# Patient Record
Sex: Male | Born: 1990 | Race: White | Hispanic: No | Marital: Married | Smoking: Current every day smoker
Health system: Southern US, Community
[De-identification: ages and names within clinical notes are randomized; demographics above are authoritative.]

## PROBLEM LIST (undated history)

## (undated) ENCOUNTER — Emergency Department (HOSPITAL_COMMUNITY): Admission: EM | Payer: Self-pay | Source: Home / Self Care

## (undated) DIAGNOSIS — S065XAA Traumatic subdural hemorrhage with loss of consciousness status unknown, initial encounter: Secondary | ICD-10-CM

## (undated) DIAGNOSIS — F32A Depression, unspecified: Secondary | ICD-10-CM

## (undated) DIAGNOSIS — F1910 Other psychoactive substance abuse, uncomplicated: Secondary | ICD-10-CM

## (undated) DIAGNOSIS — J45909 Unspecified asthma, uncomplicated: Secondary | ICD-10-CM

## (undated) DIAGNOSIS — F329 Major depressive disorder, single episode, unspecified: Secondary | ICD-10-CM

## (undated) DIAGNOSIS — F1990 Other psychoactive substance use, unspecified, uncomplicated: Secondary | ICD-10-CM

## (undated) HISTORY — DX: Traumatic subdural hemorrhage with loss of consciousness status unknown, initial encounter: S06.5XAA

## (undated) HISTORY — DX: Depression, unspecified: F32.A

## (undated) HISTORY — DX: Other psychoactive substance abuse, uncomplicated: F19.10

## (undated) HISTORY — PX: KNEE SURGERY: SHX244

---
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
## 2012-07-27 ENCOUNTER — Emergency Department: Payer: Self-pay | Admitting: Emergency Medicine

## 2012-08-17 ENCOUNTER — Emergency Department: Payer: Self-pay | Admitting: Emergency Medicine

## 2014-05-19 NOTE — H&P (Signed)
PATIENT NAME:  Edward Romero, Edward Romero MR#:  [id number] DATE OF BIRTH:  01/07/1991  DATE OF ADMISSION:  06/09/2011  REFERRING PHYSICIAN: Dorothea Glassman, MD  ADMITTING PHYSICIAN: Caryn Section, MD   REASON FOR ADMISSION: Status post overdose on multiple medications and mood lability.   CHIEF COMPLAINT: "Overdosed on multiple medications."  IDENTIFYING INFORMATION: Mr. Edward Romero is a 24 year old homeless Caucasian male currently engaged for the past few months living with his girlfriend in a car for the past one week. At times he does stay with his girlfriend's family. He has a history of polysubstance dependence and bipolar disorder.   HISTORY OF PRESENT ILLNESS: Mr. Edward Romero is a 24 year old engaged Caucasian male with a history of polysubstance dependence as well as bipolar disorder who was brought to the emergency room by ambulance called by his fiancee's stepfather after the patient was exhibiting bizarre behavior and then found to be lethargic and unresponsive. The patient had admitted to taking an overdose on Klonopin and trazodone and said that he was celebrating his birthday.  The patient had apparently been on his neighbors back porch in his boxers and then collapsed. The patient's fiancee's stepfather then called the ambulance. It is unclear whether or not the patient had a suicide attempt. His mother reports that he has been talking about suicide for the past several weeks and apparently had a suicide attempt by cutting his wrist two months ago. The patient himself denies that it was a suicide attempt and states that he was just partying. He does have a history of polysubstance dependence and has been to rehab 5 to 6 times in the past, since the age of 78. The patient has been using K2 spice every day for the past two weeks and also smoking marijuana every 2. He has a history of IV heroin use, but says he has not been using heroin since the age of 20. He also uses cocaine once every 2 to 3 months.  There is also abuse of benzodiazepines and methamphetamines. He uses alcohol only once a month. The patient does smoke cigarettes, one pack a day, since the age of 36. He does report having had some visual hallucinations last night of seeing his neighbors getting their legs chopped off. He also reports auditory hallucinations all day long yesterday, but cannot remember what the voices were saying. Thoughts during the interview were somewhat disorganized. The patient was very anxious and insight and judgment were poor. The patient was wanting to leave the hospital and did not want to acknowledge the severity of the drug use. The patient was quite agitated when he was in the emergency room and had to be placed in four-point restraints. A lot of collateral information was taken from the patient's mother who indicates that the patient has not been able to stop using drugs in the past 2 to 3 years. She says that he is no longer allowed in her household because he steals money and has been unable to hold a steady job to support himself. Although he has been to multiple rehabs, he has not had any inpatient psychiatric treatment in the past. There is a prior diagnosis of bipolar disorder and the patient had been on Seroquel and Abilify in the past but has been noncompliant with psychotropic medications for the past several months. He does not currently have an outpatient psychiatrist.   PAST PSYCHIATRIC HISTORY: As stated in the History of Present Illness. No current outpatient psychiatrist or prior inpatient psychiatric hospitalizations, only  multiple rehab treatment in the past, at [facility] and [facility]. The patient has been tried on Seroquel and Abilify in the past. He has also had court ordered substance abuse treatment. He does have a history of cutting his wrist two months.  SUBSTANCE ABUSE HISTORY:  As stated in the history of present illness, toxicology screen in the emergency room was positive for TCAs, but negative  for all other substances and ethanol level was less than 3.   FAMILY PSYCHIATRIC HISTORY: The patient's father is an alcoholic.   PAST MEDICAL HISTORY: He denies any major medical conditions. He does have a history of a TBI from a baseball injury, but no loss of consciousness. He denies any history of any prior seizures.   OUTPATIENT MEDICATIONS: None.   ALLERGIES: No known drug allergies.   SOCIAL HISTORY: The patient was born and raised in [place] primarily by his father until the age of 53. Hen then came to live with his mother and stepfather, at the age of 53, and he had been living with them over the past one year, but does not get along with his stepfather. His stepfather would not allow him in the house secondary to stealing money and drug use. The patient denies any history of any physical or sexual abuse. He has a ninth grade education and got his GED as well. He was working full-time at [employer] for a six-month period but then lost his job three weeks ago secondary to problems with transportation. He has been living with his fiancee in a car for the past one week and sometimes sleeps in his fiancee's family's house. He does not have any children.  LEGAL HISTORY: He has been arrested for possession of methamphetamines, assault, and trespassing.   MENTAL STATUS EXAM: Mr. Edward Romero is a 24 year old thin-appearing Caucasian male with short brown hair. He was alert but somewhat disoriented to the date.  He knew it was May 2013 but could not name the day of the month or day of the week.  He named the prior presidents as Obama, Bush and then [employer]. Speech was fluent and coherent. Mood was described as being "mixed". The patient said he was depressed and angry, but also felt like he was on a high "jacked up". Thought processes were somewhat disorganized at times. The patient was focused on discharge. He did not appear to be responding to internal stimuli. He denied any suicidal or homicidal thoughts. He  denied any current auditory or visual hallucinations. He denied any paranoid thoughts or delusions. Attention and concentration were poor. Judgment and insight were poor. Recall was three out of three initially and zero out of three after 5 minutes. The patient spelled world backwards as dlrow. When asked about simple proverbs, the patient said "I don't know".  SUICIDE RISK ASSESSMENT: At this time the patient remains at a moderate to high risk of harm to self and others secondary to recent mood lability as well as bizarre behavior and psychosis. He denies any access to guns. He denies any intent to harm himself but per his mother and collateral information he has been endorsing suicidal thoughts.  REVIEW OF SYSTEMS: CONSTITUTIONAL: He denies any weakness, fatigue or weight changes. He denies any fever, chills, or night sweats. HEAD: He denies headaches or dizziness. EYES: He denies any diplopia or blurred vision. ENT: No hearing loss, neck pain or throat.  RESPIRATORY: He denies any shortness of breath or cough. CARDIOVASCULAR: He denies any chest pain or orthopnea. GASTROINTESTINAL: He  denies any nausea, vomiting, or abdominal pain. He denies any change in bowel movements. GENITOURINARY: He denies incontinence or problems with frequency of urine. ENDOCRINE: He denies any heat or cold intolerance. LYMPHATIC: He denies any anemia or easy bruising. MUSCULOSKELETAL: He denies any muscle or joint pain. NEUROLOGIC: He denies any tingling or weakness. PSYCHIATRIC: Please see History of Present Illness.    PHYSICAL EXAMINATION:   VITAL SIGNS: Blood pressure 98/56, pulse 65, respirations 16, and temperature 96.8.   HEENT: Normocephalic, atraumatic. Pupils are equal, round and reactive to light and accommodation. Extraocular movements intact. Oral mucosa was moist. No lesions noted.   NECK: Supple. No cervical lymphadenopathy or thyromegaly present.   LUNGS: Clear to auscultation bilaterally. No crackles,  rales, or rhonchi.   CARDIAC: S1 and S2 present, regular rate and rhythm. No murmurs, rubs, or gallops.   ABDOMEN: Soft and normoactive bowel sounds present in all four quadrants. No tenderness noted.   EXTREMITIES: The patient did have some superficial lacerations on his right hand and some bruising around his left wrist. No rash, cyanosis, clubbing, or edema.   NEUROLOGIC: Cranial nerves II through XII grossly intact. Gait was normal and steady. Sensation intact. No hypo or hyperreflexia noted.   LABS/STUDIES: Sodium 142, potassium 3.4, chloride 111, CO2 25, BUN 10, creatinine 0.81, glucose 96, alkaline phosphatase 61, AST 19, ALT 16. CK 148. Urine tox screen positive for TCAs but negative for all other substances. Acetaminophen and salicylate levels were unremarkable. CBC within normal limits.   EKG showed a ventricular rate of 107 with a QTc interval of 453.   DIAGNOSES:   AXIS I:  1. Bipolar disorder with psychosis rule out substance-induced mood disorder with psychosis. 2. Cannabis abuse. 3. Methamphetamine abuse. 4. History of opioid dependence, in full remission.  5. Benzodiazepine abuse. 6. Cocaine abuse.   AXIS II: Deferred.   AXIS III: History of TBI.   AXIS IV: Severe - Financial problems, homelessness, comorbid substance use, lack of compliance with psychiatric treatment, and legal problems.   AXIS V: GAF at present equals 30.   ASSESSMENT AND TREATMENT RECOMMENDATIONS: Mr. Buller is a 24 year old Caucasian male with a history of polysubstance dependence as well as a mood disorder. Mood disorder can be substance-induced. He is admitting to cannabis abuse as well as using K2 spice daily for the past two weeks. It is unclear whether or not the overdose was an intentional suicide attempt. He has been talking about suicide and attempted to cut his wrist two weeks ago. In addition he did have some psychosis yesterday and visual hallucinations. These may have been  substance-induced. We will admit to inpatient psychiatry for medication management, safety, and stabilization and place on close observation and suicide precautions. He will remain under IVC at this time.  1. Mood disorder: We will plan to start the patient on Zyprexa 5 mg p.o. daily with the plan to titrate up as tolerated and as needed. We will get a lipid panel in the a.m. as well as B12 and folic acid.  2. Polysubstance dependence: The patient was advised to abstain from alcohol and all illicit drugs as they may worsen mood symptoms. We will refer for residential substance abuse treatment at [facility]. He has not been drinking alcohol heavily and therefore will not need CIWA scale.  3. Disposition: Referral to [facility] for residential substance abuse treatment and psychotropic medication management. Followup appointment will be with [facility] or [facility] in the community when he returns from [facility].  His mother is fully supportive of further substance abuse treatment on an inpatient basis. ____________________________ Doralee AlbinoAarti K. Maryruth BunKapur, MD akk:slb D: 06/09/2011 13:44:00 ET T: 06/09/2011 14:12:51 ET JOB#: [id number]  cc: Aarti K. Maryruth BunKapur, MD, <Dictator> Darliss RidgelAARTI K KAPUR MD ELECTRONICALLY SIGNED 06/10/2011 14:50

## 2015-03-19 ENCOUNTER — Emergency Department
Admission: EM | Admit: 2015-03-19 | Discharge: 2015-03-19 | Disposition: A | Discharge status: Home / Self Care | Payer: Self-pay | Attending: Emergency Medicine | Admitting: Emergency Medicine

## 2015-03-19 ENCOUNTER — Emergency Department: Payer: Self-pay

## 2015-03-19 DIAGNOSIS — J069 Acute upper respiratory infection, unspecified: Secondary | ICD-10-CM | POA: Insufficient documentation

## 2015-03-19 DIAGNOSIS — F17200 Nicotine dependence, unspecified, uncomplicated: Secondary | ICD-10-CM | POA: Insufficient documentation

## 2015-03-19 LAB — RAPID INFLUENZA A&B ANTIGENS (ARMC ONLY)
INFLUENZA A (ARMC): NOT DETECTED
INFLUENZA B (ARMC): NOT DETECTED

## 2015-03-19 MED ORDER — PREDNISONE 20 MG PO TABS: ORAL_TABLET | ORAL | Status: DC

## 2015-03-19 MED ORDER — PSEUDOEPH-BROMPHEN-DM 30-2-10 MG/5ML PO SYRP: 10.0000 mL | ORAL_SOLUTION | Freq: Four times a day (QID) | ORAL | Status: DC | PRN

## 2015-03-19 NOTE — ED Notes (Signed)
Pt arrived to Ed with c/o fever, cough, and body aches x 3 days. Pt reports fever of 103 at home. Pt took Tylenol at 6pm.

## 2015-03-19 NOTE — ED Provider Notes (Signed)
[facility] Emergency Department Provider Note  ____________________________________________  Time seen: Approximately 8:18 PM  I have reviewed the triage vital signs and the nursing notes.   HISTORY  Chief Complaint Cough    HPI Edward Romero is a 25 y.o. male, NAD, presents to the emergency department with 3 day history of cough, chest congestion, fevers, body aches. No sore throat when he coughs. Has taken Tylenol and NyQuil with minimal relief of symptoms. No known sick contacts. Denies chest pain, back pain, abdominal pain, nausea, vomiting, diarrhea. Has not had any headaches or visual changes  History reviewed. No pertinent past medical history.  There are no active problems to display for this patient.   History reviewed. No pertinent past surgical history.  Current Outpatient Rx  Name  Route  Sig  Dispense  Refill  . brompheniramine-pseudoephedrine-DM 30-2-10 MG/5ML syrup   Oral   Take 10 mLs by mouth 4 (four) times daily as needed.   200 mL   0   . predniSONE (DELTASONE) 20 MG tablet      Take 2 tablets by mouth, once daily, for 5 days   10 tablet   0     Allergies Review of patient's allergies indicates no known allergies.  History reviewed. No pertinent family history.  Social History Social History  Substance Use Topics  . Smoking status: Current Every Day Smoker  . Smokeless tobacco: None  . Alcohol Use: No     Review of Systems  Constitutional:  Positive fever, chills, fatigue  Eyes: No visual changes. No discharge ENT:  Positive sore throat, nasal congestion. No ear pain. Cardiovascular: No chest pain. Respiratory:  Positive productive cough. No shortness of breath. No wheezing.  Gastrointestinal: No abdominal pain.  No nausea, vomiting, diarrhea. Musculoskeletal:  Positive for general myalgias. Skin: Negative for rash. Neurological: Negative for headaches, focal weakness or numbness. 10-point ROS otherwise  negative.  ____________________________________________   PHYSICAL EXAM:  VITAL SIGNS: ED Triage Vitals  Enc Vitals Group     BP 03/19/15 2000 128/75 mmHg     Pulse Rate 03/19/15 1959 104     Resp 03/19/15 1959 18     Temp 03/19/15 1959 99.2 F (37.3 C)     Temp Source 03/19/15 1959 Oral     SpO2 03/19/15 1959 96 %     Weight 03/19/15 1959 140 lb (63.504 kg)     Height 03/19/15 1959 6' (1.829 m)     Head Cir --      Peak Flow --      Pain Score 03/19/15 2001 4     Pain Loc --      Pain Edu? --      Excl. in GC? --     Constitutional: Alert and oriented. Well appearing and in no acute distress. Eyes: Conjunctivae are normal. PERRL. EOMI without pain.  Head: Atraumatic. ENT:      Ears:  TMs visualized bilaterally with mild injection but with normal light reflex, no effusion, no bulging or perforation.      Nose:  Mild congestion with moderate clear rhinnorhea.      Mouth/Throat: Mucous membranes are moist.  Neck: No stridor. No cervical spine tenderness to palpation. Supple with full range of motion. Hematological/Lymphatic/Immunilogical: No cervical lymphadenopathy. Cardiovascular:    Normal rate, regular rhythm. Normal S1 and S2.  Good peripheral circulation. Respiratory:  Dry cough through visit. Normal respiratory effort without tachypnea or retractions. Lungs CTAB. Neurologic:  Normal speech and  language. No gross focal neurologic deficits are appreciated.  Skin:  Skin is warm, dry and intact. No rash noted. Psychiatric: Mood and affect are normal. Speech and behavior are normal. Patient exhibits appropriate insight and judgement.   ____________________________________________   LABS (all labs ordered are listed, but only abnormal results are displayed)  Labs Reviewed  RAPID INFLUENZA A&B ANTIGENS ([facility] ONLY)   ____________________________________________  EKG  None ____________________________________________  RADIOLOGY I have personally viewed and  evaluated these images (plain radiographs) as part of my medical decision making, as well as reviewing the written report by the radiologist.  Dg Chest 2 View  03/19/2015  CLINICAL DATA:  Fever and cough with body aches for 3 days. EXAM: CHEST  2 VIEW COMPARISON:  None. FINDINGS: The heart size and mediastinal contours are within normal limits. Both lungs are clear. The visualized skeletal structures are unremarkable. IMPRESSION: No active cardiopulmonary disease. Electronically Signed   By: Kennith Center M.D.   On: 03/19/2015 20:16    ____________________________________________    PROCEDURES  Procedure(s) performed: None    Medications - No data to display   ____________________________________________   INITIAL IMPRESSION / ASSESSMENT AND PLAN / ED COURSE  Pertinent labs & imaging results that were available during my care of the patient were reviewed by me and considered in my medical decision making (see chart for details).   Patient with negative CXR and negative in house flu testing. Patient's diagnosis is consistent with viral upper respiratory infection. Patient will be discharged home with prescriptions for Bromfed DM cough syrup and prednisone to be taken as prescribed. Patient is to follow up with his local PCP or [facility] if symptoms persist past this treatment course. Patient is given ED precautions to return to the ED for any worsening or new symptoms.    ____________________________________________  FINAL CLINICAL IMPRESSION(S) / ED DIAGNOSES  Final diagnoses:  Viral upper respiratory infection      NEW MEDICATIONS STARTED DURING THIS VISIT:  New Prescriptions   BROMPHENIRAMINE-PSEUDOEPHEDRINE-DM 30-2-10 MG/5ML SYRUP    Take 10 mLs by mouth 4 (four) times daily as needed.   PREDNISONE (DELTASONE) 20 MG TABLET    Take 2 tablets by mouth, once daily, for 5 days         Hope Pigeon, PA-C 03/19/15 2048  Sharman Cheek, MD 03/21/15  [phone]

## 2015-03-19 NOTE — ED Notes (Signed)
Pt ambulatory to room 44 without difficulty or distress noted, mask in  Place; pt reports x 3 days having fever, prod cough green sputum, sinus congestion and body aches; resp even/unlab, lungs clear; tylenol taken 2hrs PTA

## 2015-03-19 NOTE — Discharge Instructions (Signed)
Upper Respiratory Infection, Adult °Most upper respiratory infections (URIs) are a viral infection of the air passages leading to the lungs. A URI affects the nose, throat, and upper air passages. The most common type of URI is nasopharyngitis and is typically referred to as "the common cold." °URIs run their course and usually go away on their own. Most of the time, a URI does not require medical attention, but sometimes a bacterial infection in the upper airways can follow a viral infection. This is called a secondary infection. Sinus and middle ear infections are common types of secondary upper respiratory infections. °Bacterial pneumonia can also complicate a URI. A URI can worsen asthma and chronic obstructive pulmonary disease (COPD). Sometimes, these complications can require emergency medical care and may be life threatening.  °CAUSES °Almost all URIs are caused by viruses. A virus is a type of germ and can spread from one person to another.  °RISKS FACTORS °You may be at risk for a URI if:  °· You smoke.   °· You have chronic heart or lung disease. °· You have a weakened defense (immune) system.   °· You are very young or very old.   °· You have nasal allergies or asthma. °· You work in crowded or poorly ventilated areas. °· You work in health care facilities or schools. °SIGNS AND SYMPTOMS  °Symptoms typically develop 2-3 days after you come in contact with a cold virus. Most viral URIs last 7-10 days. However, viral URIs from the influenza virus (flu virus) can last 14-18 days and are typically more severe. Symptoms may include:  °· Runny or stuffy (congested) nose.   °· Sneezing.   °· Cough.   °· Sore throat.   °· Headache.   °· Fatigue.   °· Fever.   °· Loss of appetite.   °· Pain in your forehead, behind your eyes, and over your cheekbones (sinus pain). °· Muscle aches.   °DIAGNOSIS  °Your health care provider may diagnose a URI by: °· Physical exam. °· Tests to check that your symptoms are not due to  another condition such as: °¨ Strep throat. °¨ Sinusitis. °¨ Pneumonia. °¨ Asthma. °TREATMENT  °A URI goes away on its own with time. It cannot be cured with medicines, but medicines may be prescribed or recommended to relieve symptoms. Medicines may help: °· Reduce your fever. °· Reduce your cough. °· Relieve nasal congestion. °HOME CARE INSTRUCTIONS  °· Take medicines only as directed by your health care provider.   °· Gargle warm saltwater or take cough drops to comfort your throat as directed by your health care provider. °· Use a warm mist humidifier or inhale steam from a shower to increase air moisture. This may make it easier to breathe. °· Drink enough fluid to keep your urine clear or pale yellow.   °· Eat soups and other clear broths and maintain good nutrition.   °· Rest as needed.   °· Return to work when your temperature has returned to normal or as your health care provider advises. You may need to stay home longer to avoid infecting others. You can also use a face mask and careful hand washing to prevent spread of the virus. °· Increase the usage of your inhaler if you have asthma.   °· Do not use any tobacco products, including cigarettes, chewing tobacco, or electronic cigarettes. If you need help quitting, ask your health care provider. °PREVENTION  °The best way to protect yourself from getting a cold is to practice good hygiene.  °· Avoid oral or hand contact with people with cold   symptoms.   °· Wash your hands often if contact occurs.   °There is no clear evidence that vitamin C, vitamin E, echinacea, or exercise reduces the chance of developing a cold. However, it is always recommended to get plenty of rest, exercise, and practice good nutrition.  °SEEK MEDICAL CARE IF:  °· You are getting worse rather than better.   °· Your symptoms are not controlled by medicine.   °· You have chills. °· You have worsening shortness of breath. °· You have brown or red mucus. °· You have yellow or brown nasal  discharge. °· You have pain in your face, especially when you bend forward. °· You have a fever. °· You have swollen neck glands. °· You have pain while swallowing. °· You have white areas in the back of your throat. °SEEK IMMEDIATE MEDICAL CARE IF:  °· You have severe or persistent: °¨ Headache. °¨ Ear pain. °¨ Sinus pain. °¨ Chest pain. °· You have chronic lung disease and any of the following: °¨ Wheezing. °¨ Prolonged cough. °¨ Coughing up blood. °¨ A change in your usual mucus. °· You have a stiff neck. °· You have changes in your: °¨ Vision. °¨ Hearing. °¨ Thinking. °¨ Mood. °MAKE SURE YOU:  °· Understand these instructions. °· Will watch your condition. °· Will get help right away if you are not doing well or get worse. °  °This information is not intended to replace advice given to you by your health care provider. Make sure you discuss any questions you have with your health care provider. °  °Document Released: 07/07/2000 Document Revised: 05/28/2014 Document Reviewed: 04/18/2013 °Elsevier Interactive Patient Education ©2016 [employer] ° °Cool Mist Vaporizers °Vaporizers may help relieve the symptoms of a cough and cold. They add moisture to the air, which helps mucus to become thinner and less sticky. This makes it easier to breathe and cough up secretions. Cool mist vaporizers do not cause serious burns like hot mist vaporizers, which may also be called steamers or humidifiers. Vaporizers have not been proven to help with colds. You should not use a vaporizer if you are allergic to mold. °HOME CARE INSTRUCTIONS °· Follow the package instructions for the vaporizer. °· Do not use anything other than distilled water in the vaporizer. °· Do not run the vaporizer all of the time. This can cause mold or bacteria to grow in the vaporizer. °· Clean the vaporizer after each time it is used. °· Clean and dry the vaporizer well before storing it. °· Stop using the vaporizer if worsening respiratory symptoms  develop. °  °This information is not intended to replace advice given to you by your health care provider. Make sure you discuss any questions you have with your health care provider. °  °Document Released: 10/09/2003 Document Revised: 01/16/2013 Document Reviewed: 05/31/2012 °Elsevier Interactive Patient Education ©2016 [employer] ° °

## 2016-02-14 ENCOUNTER — Encounter: Payer: Self-pay | Admitting: Emergency Medicine

## 2016-02-14 ENCOUNTER — Emergency Department: Payer: Self-pay

## 2016-02-14 ENCOUNTER — Emergency Department
Admission: EM | Admit: 2016-02-14 | Discharge: 2016-02-14 | Disposition: A | Discharge status: Home / Self Care | Payer: Self-pay | Attending: Emergency Medicine | Admitting: Emergency Medicine

## 2016-02-14 DIAGNOSIS — J45909 Unspecified asthma, uncomplicated: Secondary | ICD-10-CM | POA: Insufficient documentation

## 2016-02-14 DIAGNOSIS — N201 Calculus of ureter: Secondary | ICD-10-CM | POA: Insufficient documentation

## 2016-02-14 DIAGNOSIS — F17200 Nicotine dependence, unspecified, uncomplicated: Secondary | ICD-10-CM | POA: Insufficient documentation

## 2016-02-14 DIAGNOSIS — R109 Unspecified abdominal pain: Secondary | ICD-10-CM

## 2016-02-14 HISTORY — DX: Unspecified asthma, uncomplicated: J45.909

## 2016-02-14 LAB — BASIC METABOLIC PANEL
Anion gap: 9 (ref 5–15)
BUN: 18 mg/dL (ref 6–20)
CHLORIDE: 103 mmol/L (ref 101–111)
CO2: 24 mmol/L (ref 22–32)
CREATININE: 1.54 mg/dL — AB (ref 0.61–1.24)
Calcium: 10.3 mg/dL (ref 8.9–10.3)
Glucose, Bld: 111 mg/dL — ABNORMAL HIGH (ref 65–99)
Potassium: 4.7 mmol/L (ref 3.5–5.1)
SODIUM: 136 mmol/L (ref 135–145)

## 2016-02-14 LAB — URINALYSIS, COMPLETE (UACMP) WITH MICROSCOPIC
BILIRUBIN URINE: NEGATIVE
Bacteria, UA: NONE SEEN
GLUCOSE, UA: NEGATIVE mg/dL
KETONES UR: 20 mg/dL — AB
NITRITE: NEGATIVE
PH: 5 (ref 5.0–8.0)
PROTEIN: 30 mg/dL — AB
Specific Gravity, Urine: 1.026 (ref 1.005–1.030)
Squamous Epithelial / LPF: NONE SEEN

## 2016-02-14 LAB — CBC
HCT: 46.6 % (ref 40.0–52.0)
HEMOGLOBIN: 15.9 g/dL (ref 13.0–18.0)
MCH: 31 pg (ref 26.0–34.0)
MCHC: 34.2 g/dL (ref 32.0–36.0)
MCV: 90.7 fL (ref 80.0–100.0)
PLATELETS: 236 10*3/uL (ref 150–440)
RBC: 5.14 MIL/uL (ref 4.40–5.90)
RDW: 13.4 % (ref 11.5–14.5)
WBC: 18.2 10*3/uL — ABNORMAL HIGH (ref 3.8–10.6)

## 2016-02-14 MED ORDER — HYDROCODONE-ACETAMINOPHEN 5-325 MG PO TABS: 1.0000 | ORAL_TABLET | ORAL | 0 refills | Status: DC | PRN

## 2016-02-14 MED ORDER — TAMSULOSIN HCL 0.4 MG PO CAPS: 0.4000 mg | ORAL_CAPSULE | Freq: Every day | ORAL | 0 refills | Status: DC

## 2016-02-14 MED ORDER — ONDANSETRON 4 MG PO TBDP: 4.0000 mg | ORAL_TABLET | Freq: Three times a day (TID) | ORAL | 0 refills | Status: DC | PRN

## 2016-02-14 MED ORDER — NAPROXEN 500 MG PO TABS
500.0000 mg | ORAL_TABLET | Freq: Two times a day (BID) | ORAL | 2 refills | Status: DC
Start: 2016-02-14 — End: 2016-04-28

## 2016-02-14 MED ORDER — KETOROLAC TROMETHAMINE 30 MG/ML IJ SOLN
30.0000 mg | Freq: Once | INTRAMUSCULAR | Status: AC
  Administered 2016-02-14: 30 mg via INTRAMUSCULAR

## 2016-02-14 MED ORDER — KETOROLAC TROMETHAMINE 30 MG/ML IJ SOLN
INTRAMUSCULAR | Status: AC
  Administered 2016-02-14: 30 mg via INTRAMUSCULAR
  Filled 2016-02-14: qty 1

## 2016-02-14 NOTE — ED Provider Notes (Signed)
[facility] Emergency Department Provider Note   ____________________________________________    I have reviewed the triage vital signs and the nursing notes.   HISTORY  Chief Complaint Flank Pain     HPI Edward Romero is a 26 y.o. male who presents with complaints of flank pain. Patient reports right-sided flank pain that started overnight, he reports it began abruptly. He denies fevers or chills. Mild nausea no vomiting. Has a history of a kidney stone several years ago. Pain is sharp and moderate to severe. Primarily in the right flank radiating into the right groin   Past Medical History:  Diagnosis Date  . Asthma   . Kidney stone     There are no active problems to display for this patient.   History reviewed. No pertinent surgical history.  Prior to Admission medications   Medication Sig Start Date End Date Taking? Authorizing Provider  brompheniramine-pseudoephedrine-DM 30-2-10 MG/5ML syrup Take 10 mLs by mouth 4 (four) times daily as needed. 03/19/15   [name], PA-C  HYDROcodone-acetaminophen (NORCO/VICODIN) 5-325 MG tablet Take 1 tablet by mouth every 4 (four) hours as needed for moderate pain. 02/14/16   Jene Everyobert Avyay Coger, MD  naproxen (NAPROSYN) 500 MG tablet Take 1 tablet (500 mg total) by mouth 2 (two) times daily with a meal. 02/14/16   Jene Everyobert [name], MD  ondansetron (ZOFRAN ODT) 4 MG disintegrating tablet Take 1 tablet (4 mg total) by mouth every 8 (eight) hours as needed for nausea or vomiting. 02/14/16   Jene Everyobert [name], MD  predniSONE (DELTASONE) 20 MG tablet Take 2 tablets by mouth, once daily, for 5 days 03/19/15   [name], PA-C  tamsulosin (FLOMAX) 0.4 MG CAPS capsule Take 1 capsule (0.4 mg total) by mouth daily. 02/14/16   Jene Everyobert [name], MD     Allergies Patient has no known allergies.  No family history on file.  Social History Social History  Substance Use Topics  . Smoking status: Current Every Day Smoker  .  Smokeless tobacco: Never Used  . Alcohol use Yes     Comment: occ    Review of Systems  Constitutional: No fever/chills Eyes: No visual changes.    Respiratory: Denies shortness of breath. Gastrointestinal: As above  Genitourinary: Negative for dysuria. Musculoskeletal: As above Skin: Negative for rash. Neurological: Negative for headaches   10-point ROS otherwise negative.  ____________________________________________   PHYSICAL EXAM:  VITAL SIGNS: ED Triage Vitals  Enc Vitals Group     BP 02/14/16 1411 (!) 130/11     Pulse Rate 02/14/16 1411 74     Resp 02/14/16 1411 16     Temp 02/14/16 1411 97.8 F (36.6 C)     Temp Source 02/14/16 1411 Oral     SpO2 02/14/16 1411 100 %     Weight 02/14/16 1411 140 lb (63.5 kg)     Height --      Head Circumference --      Peak Flow --      Pain Score 02/14/16 1412 8     Pain Loc --      Pain Edu? --      Excl. in GC? --     Constitutional: Alert and oriented. No acute distress. Pleasant and interactive Eyes: Conjunctivae are normal.   Nose: No congestion/rhinnorhea. Mouth/Throat: Mucous membranes are moist.    Cardiovascular: Normal rate, regular rhythm.  Good peripheral circulation. Respiratory:   No retractions. Lungs CTAB. Gastrointestinal: Soft and nontender. No distention.  No CVA tenderness. Genitourinary: deferred Musculoskeletal: No lower extremity tenderness nor edema.  Warm and well perfused Neurologic:  Normal speech and language. No gross focal neurologic deficits are appreciated.  Skin:  Skin is warm, dry and intact. No rash noted. Psychiatric: Mood and affect are normal. Speech and behavior are normal.  ____________________________________________   LABS (all labs ordered are listed, but only abnormal results are displayed)  Labs Reviewed  CBC - Abnormal; Notable for the following:       Result Value   WBC 18.2 (*)    All other components within normal limits  BASIC METABOLIC PANEL - Abnormal;  Notable for the following:    Glucose, Bld 111 (*)    Creatinine, Ser 1.54 (*)    All other components within normal limits  URINALYSIS, COMPLETE (UACMP) WITH MICROSCOPIC   ____________________________________________  EKG  None ____________________________________________  RADIOLOGY  CT demonstrates a 5 mm proximal right stone ____________________________________________   PROCEDURES  Procedure(s) performed: No    Critical Care performed: No ____________________________________________   INITIAL IMPRESSION / ASSESSMENT AND PLAN / ED COURSE  Pertinent labs & imaging results that were available during my care of the patient were reviewed by me and considered in my medical decision making (see chart for details).  Patient persist with right-sided flank pain of relatively abrupt onset, suspicious for kidney stone. Pending urinalysis and CT renal stone study. IM Toradol given for pain.  ----------------------------------------- 5:35 PM on 02/14/2016 -----------------------------------------     patient had complete relief of pain with Toradol. He is well-appearing and in no acute distress. If urinalysis is reassuring we will discharge with pain medications and urology follow-up. ____________________________________________   FINAL CLINICAL IMPRESSION(S) / ED DIAGNOSES  Final diagnoses:  Right flank pain  Ureterolithiasis      NEW MEDICATIONS STARTED DURING THIS VISIT:  New Prescriptions   HYDROCODONE-ACETAMINOPHEN (NORCO/VICODIN) 5-325 MG TABLET    Take 1 tablet by mouth every 4 (four) hours as needed for moderate pain.   NAPROXEN (NAPROSYN) 500 MG TABLET    Take 1 tablet (500 mg total) by mouth 2 (two) times daily with a meal.   ONDANSETRON (ZOFRAN ODT) 4 MG DISINTEGRATING TABLET    Take 1 tablet (4 mg total) by mouth every 8 (eight) hours as needed for nausea or vomiting.   TAMSULOSIN (FLOMAX) 0.4 MG CAPS CAPSULE    Take 1 capsule (0.4 mg total) by mouth  daily.     Note:  This document was prepared using Dragon voice recognition software and may include unintentional dictation errors.    [name], MD 02/14/16 [phone]

## 2016-02-14 NOTE — ED Triage Notes (Signed)
Patient to ED c/o right flank pain. Patient states that pain started this morning around 4 am. Patient states that he has had 1 episode of vomiting. Patient has hx/o kidney stones.

## 2016-04-19 ENCOUNTER — Encounter (HOSPITAL_COMMUNITY): Payer: Self-pay | Admitting: Emergency Medicine

## 2016-04-19 ENCOUNTER — Emergency Department (HOSPITAL_COMMUNITY): Payer: Self-pay

## 2016-04-19 ENCOUNTER — Observation Stay (HOSPITAL_COMMUNITY)
Admission: EM | Admit: 2016-04-19 | Discharge: 2016-04-20 | Disposition: A | Discharge status: Home / Self Care | Payer: Self-pay | Attending: Neurological Surgery | Admitting: Neurological Surgery

## 2016-04-19 DIAGNOSIS — S0101XA Laceration without foreign body of scalp, initial encounter: Secondary | ICD-10-CM | POA: Insufficient documentation

## 2016-04-19 DIAGNOSIS — Z791 Long term (current) use of non-steroidal anti-inflammatories (NSAID): Secondary | ICD-10-CM | POA: Insufficient documentation

## 2016-04-19 DIAGNOSIS — Z79899 Other long term (current) drug therapy: Secondary | ICD-10-CM | POA: Insufficient documentation

## 2016-04-19 DIAGNOSIS — J45909 Unspecified asthma, uncomplicated: Secondary | ICD-10-CM | POA: Insufficient documentation

## 2016-04-19 DIAGNOSIS — I6200 Nontraumatic subdural hemorrhage, unspecified: Secondary | ICD-10-CM

## 2016-04-19 DIAGNOSIS — S02119A Unspecified fracture of occiput, initial encounter for closed fracture: Secondary | ICD-10-CM | POA: Insufficient documentation

## 2016-04-19 DIAGNOSIS — H73892 Other specified disorders of tympanic membrane, left ear: Secondary | ICD-10-CM | POA: Insufficient documentation

## 2016-04-19 DIAGNOSIS — F17200 Nicotine dependence, unspecified, uncomplicated: Secondary | ICD-10-CM | POA: Insufficient documentation

## 2016-04-19 DIAGNOSIS — S065X1A Traumatic subdural hemorrhage with loss of consciousness of 30 minutes or less, initial encounter: Principal | ICD-10-CM | POA: Insufficient documentation

## 2016-04-19 DIAGNOSIS — E119 Type 2 diabetes mellitus without complications: Secondary | ICD-10-CM | POA: Insufficient documentation

## 2016-04-19 DIAGNOSIS — Z7952 Long term (current) use of systemic steroids: Secondary | ICD-10-CM | POA: Insufficient documentation

## 2016-04-19 HISTORY — DX: Depression, unspecified: F32.A

## 2016-04-19 HISTORY — DX: Major depressive disorder, single episode, unspecified: F32.9

## 2016-04-19 HISTORY — DX: Other psychoactive substance use, unspecified, uncomplicated: F19.90

## 2016-04-19 LAB — CBC WITH DIFFERENTIAL/PLATELET
BASOS PCT: 0 %
Basophils Absolute: 0 10*3/uL (ref 0.0–0.1)
EOS ABS: 0 10*3/uL (ref 0.0–0.7)
EOS PCT: 0 %
HCT: 49.4 % (ref 39.0–52.0)
HEMOGLOBIN: 17.1 g/dL — AB (ref 13.0–17.0)
Lymphocytes Relative: 6 %
Lymphs Abs: 2 10*3/uL (ref 0.7–4.0)
MCH: 31.4 pg (ref 26.0–34.0)
MCHC: 34.6 g/dL (ref 30.0–36.0)
MCV: 90.8 fL (ref 78.0–100.0)
MONO ABS: 3 10*3/uL — AB (ref 0.1–1.0)
Monocytes Relative: 9 %
NEUTROS ABS: 28.7 10*3/uL — AB (ref 1.7–7.7)
NEUTROS PCT: 85 %
PLATELETS: 291 10*3/uL (ref 150–400)
RBC: 5.44 MIL/uL (ref 4.22–5.81)
RDW: 13.1 % (ref 11.5–15.5)
SMEAR REVIEW: ADEQUATE
WBC: 33.7 10*3/uL — ABNORMAL HIGH (ref 4.0–10.5)

## 2016-04-19 LAB — COMPREHENSIVE METABOLIC PANEL
ALK PHOS: 79 U/L (ref 38–126)
ALT: 166 U/L — AB (ref 17–63)
ANION GAP: 18 — AB (ref 5–15)
AST: 73 U/L — ABNORMAL HIGH (ref 15–41)
Albumin: 5 g/dL (ref 3.5–5.0)
BUN: 28 mg/dL — ABNORMAL HIGH (ref 6–20)
CO2: 19 mmol/L — ABNORMAL LOW (ref 22–32)
Calcium: 10 mg/dL (ref 8.9–10.3)
Chloride: 101 mmol/L (ref 101–111)
Creatinine, Ser: 1.29 mg/dL — ABNORMAL HIGH (ref 0.61–1.24)
Glucose, Bld: 69 mg/dL (ref 65–99)
Potassium: 4 mmol/L (ref 3.5–5.1)
Sodium: 138 mmol/L (ref 135–145)
TOTAL PROTEIN: 8.8 g/dL — AB (ref 6.5–8.1)
Total Bilirubin: 1.6 mg/dL — ABNORMAL HIGH (ref 0.3–1.2)

## 2016-04-19 LAB — ACETAMINOPHEN LEVEL

## 2016-04-19 LAB — RAPID URINE DRUG SCREEN, HOSP PERFORMED
Amphetamines: POSITIVE — AB
Barbiturates: NOT DETECTED
Benzodiazepines: NOT DETECTED
Cocaine: POSITIVE — AB
Opiates: NOT DETECTED
TETRAHYDROCANNABINOL: NOT DETECTED

## 2016-04-19 LAB — CBG MONITORING, ED: Glucose-Capillary: 77 mg/dL (ref 65–99)

## 2016-04-19 LAB — SALICYLATE LEVEL

## 2016-04-19 LAB — ETHANOL

## 2016-04-19 MED ORDER — TETANUS-DIPHTH-ACELL PERTUSSIS 5-2.5-18.5 LF-MCG/0.5 IM SUSP
0.5000 mL | Freq: Once | INTRAMUSCULAR | Status: AC
  Administered 2016-04-19: 0.5 mL via INTRAMUSCULAR

## 2016-04-19 MED ORDER — CEFAZOLIN IN D5W 1 GM/50ML IV SOLN
1.0000 g | Freq: Once | INTRAVENOUS | Status: AC
  Administered 2016-04-19: 1 g via INTRAVENOUS
  Filled 2016-04-19: qty 50

## 2016-04-19 MED ORDER — THIAMINE HCL 100 MG/ML IJ SOLN
Freq: Once | INTRAVENOUS | Status: AC
  Administered 2016-04-19: 10:00:00 via INTRAVENOUS
  Filled 2016-04-19: qty 1000

## 2016-04-19 MED ORDER — IOPAMIDOL (ISOVUE-370) INJECTION 76%
INTRAVENOUS | Status: AC
  Administered 2016-04-19: 50 mL
  Filled 2016-04-19: qty 50

## 2016-04-19 MED ORDER — SODIUM CHLORIDE 0.9 % IV BOLUS (SEPSIS)
1000.0000 mL | Freq: Once | INTRAVENOUS | Status: AC
  Administered 2016-04-19: 1000 mL via INTRAVENOUS

## 2016-04-19 MED ORDER — MORPHINE SULFATE (PF) 2 MG/ML IV SOLN
2.0000 mg | INTRAVENOUS | Status: DC | PRN
  Administered 2016-04-19 (×3): 2 mg via INTRAVENOUS
  Filled 2016-04-19 (×3): qty 1

## 2016-04-19 MED ORDER — NICOTINE 14 MG/24HR TD PT24
14.0000 mg | MEDICATED_PATCH | Freq: Every day | TRANSDERMAL | Status: DC
  Administered 2016-04-19: 14 mg via TRANSDERMAL
  Filled 2016-04-19: qty 1

## 2016-04-19 MED ORDER — OXYCODONE-ACETAMINOPHEN 5-325 MG PO TABS
1.0000 | ORAL_TABLET | ORAL | Status: DC | PRN
  Administered 2016-04-19 – 2016-04-20 (×6): 2 via ORAL
  Filled 2016-04-19 (×6): qty 2
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
## 2016-04-19 NOTE — ED Notes (Addendum)
Pt transported back to CT.  Pt's mother is here- talking with [place] [place] [employer].
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
## 2016-04-19 NOTE — ED Notes (Signed)
Returned from CT.  [place] [employer] x 2 at bedside.  Chaplain has tried to contact pt's mother without success.

## 2016-04-19 NOTE — ED Notes (Signed)
Notified pt's mother [phone]- Lacie DraftMisty Satterfield that pt is at ED.  She is on her way to the hospital.

## 2016-04-19 NOTE — Progress Notes (Signed)
Patient ID: Edward Romero, male   DOB: 06/19/1990, 26 y.o.   MRN: [id number] Patient is more alert this afternoon He is aware he cannot hear anything out of the left ear I discussed the situation with him noting that there is a 50-50 chance that he could get hearing back

## 2016-04-19 NOTE — ED Notes (Signed)
PT TO CT
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
## 2016-04-19 NOTE — Progress Notes (Signed)
Received from ER via stretcher; mother at bedside; transferred to the bed;oriented patient and mother to unit and unit routine; fall safety explained; bed alarm set; bed low and locked.
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
## 2016-04-20 LAB — HIV ANTIBODY (ROUTINE TESTING W REFLEX): HIV SCREEN 4TH GENERATION: NONREACTIVE

## 2016-04-20 MED ORDER — TRAMADOL HCL 50 MG PO TABS: 50.0000 mg | ORAL_TABLET | Freq: Four times a day (QID) | ORAL | 1 refills | Status: DC | PRN

## 2016-04-20 NOTE — Progress Notes (Signed)
Upon assessment at this time, RN noted visible bruising underneath bilateral eyes with right face swollen. No bleeding noted. Pt c/o hedache  But no other distress. Mom at bedside. Patient request to be discharge. Dr. Danielle DessElsner paged.   Sim BoastHavy, RN

## 2016-04-20 NOTE — Progress Notes (Signed)
Discharge instructions reviewed with patient. All questions answered at this time. RX given. Awaiting for transportation from mom.   Sim BoastHavy, RN
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
## 2016-04-21 ENCOUNTER — Encounter: Payer: Self-pay | Admitting: Emergency Medicine

## 2016-04-27 ENCOUNTER — Emergency Department (HOSPITAL_COMMUNITY): Payer: Self-pay

## 2016-04-27 ENCOUNTER — Emergency Department (HOSPITAL_COMMUNITY)
Admission: EM | Admit: 2016-04-27 | Discharge: 2016-04-28 | Disposition: A | Discharge status: Home / Self Care | Payer: Self-pay | Attending: Emergency Medicine | Admitting: Emergency Medicine

## 2016-04-27 ENCOUNTER — Encounter (HOSPITAL_COMMUNITY): Payer: Self-pay | Admitting: Emergency Medicine

## 2016-04-27 DIAGNOSIS — Z5181 Encounter for therapeutic drug level monitoring: Secondary | ICD-10-CM | POA: Insufficient documentation

## 2016-04-27 DIAGNOSIS — J45909 Unspecified asthma, uncomplicated: Secondary | ICD-10-CM | POA: Insufficient documentation

## 2016-04-27 DIAGNOSIS — Z8679 Personal history of other diseases of the circulatory system: Secondary | ICD-10-CM | POA: Insufficient documentation

## 2016-04-27 DIAGNOSIS — F17200 Nicotine dependence, unspecified, uncomplicated: Secondary | ICD-10-CM | POA: Insufficient documentation

## 2016-04-27 DIAGNOSIS — Z87828 Personal history of other (healed) physical injury and trauma: Secondary | ICD-10-CM

## 2016-04-27 DIAGNOSIS — E119 Type 2 diabetes mellitus without complications: Secondary | ICD-10-CM | POA: Insufficient documentation

## 2016-04-27 DIAGNOSIS — Z79899 Other long term (current) drug therapy: Secondary | ICD-10-CM | POA: Insufficient documentation

## 2016-04-27 DIAGNOSIS — G510 Bell's palsy: Secondary | ICD-10-CM | POA: Insufficient documentation

## 2016-04-27 LAB — COMPREHENSIVE METABOLIC PANEL
ALBUMIN: 4.1 g/dL (ref 3.5–5.0)
ALK PHOS: 70 U/L (ref 38–126)
ALT: 35 U/L (ref 17–63)
AST: 26 U/L (ref 15–41)
Anion gap: 8 (ref 5–15)
BILIRUBIN TOTAL: 0.6 mg/dL (ref 0.3–1.2)
BUN: 16 mg/dL (ref 6–20)
CALCIUM: 9.2 mg/dL (ref 8.9–10.3)
CO2: 26 mmol/L (ref 22–32)
CREATININE: 1.02 mg/dL (ref 0.61–1.24)
Chloride: 101 mmol/L (ref 101–111)
GFR calc Af Amer: >60 mL/min (ref >60)
GLUCOSE: 94 mg/dL (ref 65–99)
Potassium: 4.3 mmol/L (ref 3.5–5.1)
Sodium: 135 mmol/L (ref 135–145)
TOTAL PROTEIN: 7.2 g/dL (ref 6.5–8.1)

## 2016-04-27 LAB — CBC
HEMATOCRIT: 42.4 % (ref 39.0–52.0)
HEMOGLOBIN: 14.5 g/dL (ref 13.0–17.0)
MCH: 30.7 pg (ref 26.0–34.0)
MCHC: 34.2 g/dL (ref 30.0–36.0)
MCV: 89.8 fL (ref 78.0–100.0)
Platelets: 292 10*3/uL (ref 150–400)
RBC: 4.72 MIL/uL (ref 4.22–5.81)
RDW: 13 % (ref 11.5–15.5)
WBC: 11.3 10*3/uL — AB (ref 4.0–10.5)

## 2016-04-27 LAB — DIFFERENTIAL
BASOS PCT: 0 %
Basophils Absolute: 0 10*3/uL (ref 0.0–0.1)
Eosinophils Absolute: 0.5 10*3/uL (ref 0.0–0.7)
Eosinophils Relative: 4 %
Lymphocytes Relative: 40 %
Lymphs Abs: 4.5 10*3/uL — ABNORMAL HIGH (ref 0.7–4.0)
MONO ABS: 1 10*3/uL (ref 0.1–1.0)
MONOS PCT: 9 %
Neutro Abs: 5.3 10*3/uL (ref 1.7–7.7)
Neutrophils Relative %: 47 %

## 2016-04-27 LAB — APTT: APTT: 32 s (ref 24–36)

## 2016-04-27 LAB — I-STAT CHEM 8, ED
BUN: 23 mg/dL — AB (ref 6–20)
CALCIUM ION: 1.21 mmol/L (ref 1.15–1.40)
CREATININE: 1 mg/dL (ref 0.61–1.24)
Chloride: 99 mmol/L — ABNORMAL LOW (ref 101–111)
GLUCOSE: 89 mg/dL (ref 65–99)
HEMATOCRIT: 41 % (ref 39.0–52.0)
HEMOGLOBIN: 13.9 g/dL (ref 13.0–17.0)
Potassium: 4.4 mmol/L (ref 3.5–5.1)
Sodium: 138 mmol/L (ref 135–145)
TCO2: 32 mmol/L (ref 0–100)

## 2016-04-27 LAB — I-STAT TROPONIN, ED: TROPONIN I, POC: 0 ng/mL (ref 0.00–0.08)

## 2016-04-27 LAB — PROTIME-INR
INR: 1.02
Prothrombin Time: 13.4 seconds (ref 11.4–15.2)

## 2016-04-27 NOTE — ED Triage Notes (Signed)
Pt. reports left facial asymmetry onset 2 days ago and slurred speech yesterday , he was admitted 04/19/16 dx: Subdural Hematoma , equal grips with no arm drift , alert and oriented , speech clear/respirations unlabored.
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
## 2016-04-28 MED ORDER — ARTIFICIAL TEARS OP OINT: TOPICAL_OINTMENT | OPHTHALMIC | 0 refills | Status: DC | PRN

## 2016-04-28 MED ORDER — PREDNISONE 20 MG PO TABS
40.0000 mg | ORAL_TABLET | Freq: Every day | ORAL | 0 refills | Status: DC
Start: 2016-04-28 — End: 2016-08-11

## 2016-06-07 ENCOUNTER — Emergency Department
Admission: EM | Admit: 2016-06-07 | Discharge: 2016-06-07 | Disposition: A | Discharge status: Home / Self Care | Payer: Self-pay | Attending: Emergency Medicine | Admitting: Emergency Medicine

## 2016-06-07 ENCOUNTER — Encounter (HOSPITAL_COMMUNITY): Payer: Self-pay | Admitting: Emergency Medicine

## 2016-06-07 ENCOUNTER — Emergency Department (HOSPITAL_COMMUNITY)
Admission: EM | Admit: 2016-06-07 | Discharge: 2016-06-07 | Disposition: A | Discharge status: Home / Self Care | Payer: Self-pay | Attending: Dermatology | Admitting: Dermatology

## 2016-06-07 ENCOUNTER — Encounter: Payer: Self-pay | Admitting: Emergency Medicine

## 2016-06-07 DIAGNOSIS — E119 Type 2 diabetes mellitus without complications: Secondary | ICD-10-CM | POA: Insufficient documentation

## 2016-06-07 DIAGNOSIS — Z5321 Procedure and treatment not carried out due to patient leaving prior to being seen by health care provider: Secondary | ICD-10-CM | POA: Insufficient documentation

## 2016-06-07 DIAGNOSIS — R112 Nausea with vomiting, unspecified: Secondary | ICD-10-CM | POA: Insufficient documentation

## 2016-06-07 DIAGNOSIS — J45909 Unspecified asthma, uncomplicated: Secondary | ICD-10-CM | POA: Insufficient documentation

## 2016-06-07 DIAGNOSIS — R1032 Left lower quadrant pain: Secondary | ICD-10-CM | POA: Insufficient documentation

## 2016-06-07 DIAGNOSIS — F17200 Nicotine dependence, unspecified, uncomplicated: Secondary | ICD-10-CM | POA: Insufficient documentation

## 2016-06-07 DIAGNOSIS — R197 Diarrhea, unspecified: Secondary | ICD-10-CM | POA: Insufficient documentation

## 2016-06-07 DIAGNOSIS — R1110 Vomiting, unspecified: Secondary | ICD-10-CM | POA: Insufficient documentation

## 2016-06-07 LAB — CBC
HCT: 50.1 % (ref 40.0–52.0)
Hemoglobin: 17.1 g/dL (ref 13.0–18.0)
MCH: 30.8 pg (ref 26.0–34.0)
MCHC: 34.1 g/dL (ref 32.0–36.0)
MCV: 90.5 fL (ref 80.0–100.0)
PLATELETS: 209 10*3/uL (ref 150–440)
RBC: 5.53 MIL/uL (ref 4.40–5.90)
RDW: 14.1 % (ref 11.5–14.5)
WBC: 14.8 10*3/uL — ABNORMAL HIGH (ref 3.8–10.6)

## 2016-06-07 LAB — COMPREHENSIVE METABOLIC PANEL
ALBUMIN: 4.4 g/dL (ref 3.5–5.0)
ALT: 38 U/L (ref 17–63)
AST: 41 U/L (ref 15–41)
Alkaline Phosphatase: 64 U/L (ref 38–126)
Anion gap: 12 (ref 5–15)
BUN: 21 mg/dL — ABNORMAL HIGH (ref 6–20)
CHLORIDE: 101 mmol/L (ref 101–111)
CO2: 24 mmol/L (ref 22–32)
CREATININE: 1.06 mg/dL (ref 0.61–1.24)
Calcium: 9 mg/dL (ref 8.9–10.3)
GFR calc non Af Amer: >60 mL/min (ref >60)
GLUCOSE: 138 mg/dL — AB (ref 65–99)
Potassium: 3.9 mmol/L (ref 3.5–5.1)
SODIUM: 137 mmol/L (ref 135–145)
Total Bilirubin: 0.9 mg/dL (ref 0.3–1.2)
Total Protein: 7.9 g/dL (ref 6.5–8.1)

## 2016-06-07 LAB — URINALYSIS, COMPLETE (UACMP) WITH MICROSCOPIC
Bacteria, UA: NONE SEEN
Bilirubin Urine: NEGATIVE
Glucose, UA: NEGATIVE mg/dL
Hgb urine dipstick: NEGATIVE
KETONES UR: NEGATIVE mg/dL
Leukocytes, UA: NEGATIVE
Nitrite: NEGATIVE
PH: 7 (ref 5.0–8.0)
PROTEIN: NEGATIVE mg/dL
RBC / HPF: NONE SEEN RBC/hpf (ref 0–5)
SQUAMOUS EPITHELIAL / LPF: NONE SEEN
Specific Gravity, Urine: 1.027 (ref 1.005–1.030)

## 2016-06-07 LAB — LIPASE, BLOOD: LIPASE: 20 U/L (ref 11–51)

## 2016-06-07 MED ORDER — ONDANSETRON 4 MG PO TBDP
4.0000 mg | ORAL_TABLET | Freq: Once | ORAL | Status: DC | PRN
  Filled 2016-06-07: qty 1

## 2016-06-07 NOTE — ED Notes (Signed)
Pt not in subwait when this RN called. [employer] officer in subwait sated that pt left

## 2016-06-07 NOTE — ED Triage Notes (Addendum)
Patient ambulatory to triage with steady gait, without difficulty or distress noted; pt reports here earlier but left due to wait, [facility] and wasn't [facility] there either due to wait "and I need a note for work"; pt reports here for mid abd pain accomp by N/V since last night; all results noted in computer

## 2016-06-07 NOTE — ED Triage Notes (Signed)
Patient presents to the ED with nausea, vomiting and diarrhea since last night.  Patient states he started getting sick after eating some domino[employer] pizza last night.  Patient states he has vomited more than 10 times and had diarrhea x 2.  Patient reports abdominal pain when he is vomiting.  Patient states he drank an "aloe vera drink" last night and it improved the nausea for a short time.

## 2016-06-07 NOTE — ED Notes (Signed)
Called for room x 3, no answer, LWBS

## 2016-06-07 NOTE — ED Notes (Signed)
IV removed per Charge RN's request prior to placing patient back in waiting room.

## 2016-06-07 NOTE — ED Triage Notes (Signed)
Pt reports n/v/d since last night, was [facility] and had [facility] work, pt states he got tired of waiting and left to come here. Pt has iv in his arm, states they would not remove it at [facility].

## 2016-06-08 ENCOUNTER — Emergency Department
Admission: EM | Admit: 2016-06-08 | Discharge: 2016-06-08 | Disposition: A | Discharge status: Home / Self Care | Payer: Self-pay | Attending: Emergency Medicine | Admitting: Emergency Medicine

## 2016-06-08 DIAGNOSIS — R197 Diarrhea, unspecified: Secondary | ICD-10-CM

## 2016-06-08 DIAGNOSIS — R1032 Left lower quadrant pain: Secondary | ICD-10-CM

## 2016-06-08 DIAGNOSIS — R112 Nausea with vomiting, unspecified: Secondary | ICD-10-CM

## 2016-06-08 MED ORDER — ONDANSETRON 4 MG PO TBDP: 4.0000 mg | ORAL_TABLET | Freq: Three times a day (TID) | ORAL | 0 refills | Status: DC | PRN

## 2016-06-08 MED ORDER — ONDANSETRON 4 MG PO TBDP
4.0000 mg | ORAL_TABLET | Freq: Once | ORAL | Status: AC
  Administered 2016-06-08: 4 mg via ORAL
  Filled 2016-06-08: qty 1

## 2016-06-08 NOTE — ED Notes (Signed)
Pt tolerated PO fluids. No emesis at this time. Pt states nausea moderately subsided at this time.

## 2016-06-08 NOTE — ED Provider Notes (Signed)
[facility] Emergency Department Provider Note   ____________________________________________   First MD Initiated Contact with Patient 06/08/16 [phone]     (approximate)  I have reviewed the triage vital signs and the nursing notes.   HISTORY  Chief Complaint Emesis    HPI Edward Romero is a 26 y.o. male who comes into the hospital today with some vomiting, diarrhea and abdominal pain. The patient was here in the emergency department waiting room earlier today. He reports that he was waiting with the child was taking a very long time so he left and [facility] as well as with the patient decided to come back here for evaluation. He reports that last night he was vomiting a significant amount. It was every 5 minutes. He tried to drink but couldn't keep anything down. He reports that when he arrived here today he started having diarrhea. The patient reports that he has some left-sided abdominal pain to touch. He reports that the pain is about a 2-3 out of 10 in intensity right now. He reports it is much worse when he is going to vomit. The patient has had some nausea but denies any sick contacts. The patient works with food. He reports he also has had some intermittent shortness of breath but denies any at this time. The patient is here today for evaluation and work no.   Past Medical History:  Diagnosis Date  . Asthma   . Depression   . Diabetes mellitus without complication (HCC)   . Drug use   . Kidney stone     Patient Active Problem List   Diagnosis Date Noted  . Subdural hemorrhage (HCC) 04/19/2016  . Closed head injury with concussion 04/19/2016    Past Surgical History:  Procedure Laterality Date  . KNEE SURGERY      Prior to Admission medications   Medication Sig Start Date End Date Taking? Authorizing Provider  artificial tears (LACRILUBE) OINT ophthalmic ointment Place into both eyes every 4 (four)  hours as needed for dry eyes. 04/28/16   Horton, Mayer Maskerourtney F, MD  HYDROcodone-acetaminophen (NORCO/VICODIN) 5-325 MG tablet Take 1 tablet by mouth every 4 (four) hours as needed for moderate pain. 02/14/16   Jene EveryKinner, Robert, MD  ondansetron (ZOFRAN ODT) 4 MG disintegrating tablet Take 1 tablet (4 mg total) by mouth every 8 (eight) hours as needed for nausea or vomiting. 02/14/16   Jene EveryKinner, Robert, MD  ondansetron (ZOFRAN ODT) 4 MG disintegrating tablet Take 1 tablet (4 mg total) by mouth every 8 (eight) hours as needed for nausea or vomiting. 06/08/16   Rebecka ApleyWebster, Faithe Ariola P, MD  predniSONE (DELTASONE) 20 MG tablet Take 2 tablets (40 mg total) by mouth daily. 04/28/16   Horton, Mayer Maskerourtney F, MD  traMADol (ULTRAM) 50 MG tablet Take 1 tablet (50 mg total) by mouth every 6 (six) hours as needed. Patient taking differently: Take 50 mg by mouth every 6 (six) hours as needed for moderate pain.  04/20/16   Barnett AbuElsner, Henry, MD    Allergies Patient has no known allergies.  No family history on file.  Social History Social History  Substance Use Topics  . Smoking status: Current Every Day Smoker  . Smokeless tobacco: Never Used  . Alcohol use Yes     Comment: occ    Review of Systems  Constitutional: No fever/chills Eyes: No visual changes. ENT: No sore throat. Cardiovascular: Denies chest pain. Respiratory: Denies shortness of breath.  Gastrointestinal:  abdominal pain.   nausea,  vomiting.   diarrhea.  No constipation. Genitourinary: Negative for dysuria. Musculoskeletal: Negative for back pain. Skin: Negative for rash. Neurological: Negative for headaches, focal weakness or numbness.   ____________________________________________   PHYSICAL EXAM:  VITAL SIGNS: ED Triage Vitals  Enc Vitals Group     BP 06/07/16 2225 126/75     Pulse Rate 06/07/16 2225 94     Resp 06/07/16 2225 18     Temp 06/07/16 2225 99 F (37.2 C)     Temp Source 06/07/16 2225 Oral     SpO2 06/07/16 2225 98 %     Weight  06/07/16 2226 140 lb (63.5 kg)     Height 06/07/16 2226 6' (1.829 m)     Head Circumference --      Peak Flow --      Pain Score 06/07/16 2225 2     Pain Loc --      Pain Edu? --      Excl. in GC? --     Constitutional: Alert and oriented. Well appearing and in no acute distress. Eyes: Conjunctivae are normal. PERRL. EOMI. Head: Atraumatic. Nose: No congestion/rhinnorhea. Mouth/Throat: Mucous membranes are moist.  Oropharynx non-erythematous. Cardiovascular: Normal rate, regular rhythm. Grossly normal heart sounds.  Good peripheral circulation. Respiratory: Normal respiratory effort.  No retractions. Lungs CTAB. Gastrointestinal: Soft and nontender. No distention. Positive bowel sounds Musculoskeletal: No lower extremity tenderness nor edema.  Neurologic:  Normal speech and language.  Skin:  Skin is warm, dry and intact.  Psychiatric: Mood and affect are normal.   ____________________________________________   LABS (all labs ordered are listed, but only abnormal results are displayed)  Labs Reviewed - No data to display ____________________________________________  EKG  none ____________________________________________  RADIOLOGY  none ____________________________________________   PROCEDURES  Procedure(s) performed: None  Procedures  Critical Care performed: No  ____________________________________________   INITIAL IMPRESSION / ASSESSMENT AND PLAN / ED COURSE  Pertinent labs & imaging results that were available during my care of the patient were reviewed by me and considered in my medical decision making (see chart for details).  This is a 26 year old male who comes into the hospital today with some abdominal pain nausea vomiting and diarrhea. 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 I did give the patient some Zofran orally and then some ginger ale. He was able to drink ginger ale without any worsen pain, nausea  or any vomiting. I feel that the patient has some gastroenteritis given his vomiting and his diarrhea symptoms. The patient will be discharged home and should return with any worsening condition. I do not feel that he needs any imaging studies at this time as his pain is minimal and he does not have significant tenderness to palpation.      ____________________________________________   FINAL CLINICAL IMPRESSION(S) / ED DIAGNOSES  Final diagnoses:  Nausea vomiting and diarrhea  Left lower quadrant pain      NEW MEDICATIONS STARTED DURING THIS VISIT:  New Prescriptions   ONDANSETRON (ZOFRAN ODT) 4 MG DISINTEGRATING TABLET    Take 1 tablet (4 mg total) by mouth every 8 (eight) hours as needed for nausea or vomiting.     Note:  This document was prepared using Dragon voice recognition software and may include unintentional dictation errors.    Rebecka Apley, MD 06/08/16 [phone]

## 2016-06-08 NOTE — ED Notes (Signed)
Pt provided gingerale and ice chips at this time for PO challenge

## 2016-06-08 NOTE — ED Notes (Signed)

## 2016-08-08 ENCOUNTER — Encounter: Payer: Self-pay | Admitting: Emergency Medicine

## 2016-08-08 ENCOUNTER — Emergency Department: Payer: Self-pay

## 2016-08-08 ENCOUNTER — Emergency Department
Admission: EM | Admit: 2016-08-08 | Discharge: 2016-08-09 | Disposition: A | Discharge status: Other Acute Inpatient Hospital | Payer: Self-pay | Attending: Student in an Organized Health Care Education/Training Program | Admitting: Student in an Organized Health Care Education/Training Program

## 2016-08-08 DIAGNOSIS — F32A Depression, unspecified: Secondary | ICD-10-CM

## 2016-08-08 DIAGNOSIS — F329 Major depressive disorder, single episode, unspecified: Secondary | ICD-10-CM | POA: Insufficient documentation

## 2016-08-08 DIAGNOSIS — R45850 Homicidal ideations: Secondary | ICD-10-CM | POA: Insufficient documentation

## 2016-08-08 DIAGNOSIS — R4182 Altered mental status, unspecified: Secondary | ICD-10-CM | POA: Insufficient documentation

## 2016-08-08 DIAGNOSIS — F1210 Cannabis abuse, uncomplicated: Secondary | ICD-10-CM | POA: Insufficient documentation

## 2016-08-08 DIAGNOSIS — E119 Type 2 diabetes mellitus without complications: Secondary | ICD-10-CM | POA: Insufficient documentation

## 2016-08-08 DIAGNOSIS — F17200 Nicotine dependence, unspecified, uncomplicated: Secondary | ICD-10-CM | POA: Insufficient documentation

## 2016-08-08 DIAGNOSIS — J45909 Unspecified asthma, uncomplicated: Secondary | ICD-10-CM | POA: Insufficient documentation

## 2016-08-08 DIAGNOSIS — Z79899 Other long term (current) drug therapy: Secondary | ICD-10-CM | POA: Insufficient documentation

## 2016-08-08 LAB — SALICYLATE LEVEL: Salicylate Lvl: <7 mg/dL (ref 2.8–30.0)

## 2016-08-08 LAB — URINE DRUG SCREEN, QUALITATIVE (ARMC ONLY)
AMPHETAMINES, UR SCREEN: NOT DETECTED
BENZODIAZEPINE, UR SCRN: NOT DETECTED
Barbiturates, Ur Screen: NOT DETECTED
Cannabinoid 50 Ng, Ur ~~LOC~~: POSITIVE — AB
Cocaine Metabolite,Ur ~~LOC~~: POSITIVE — AB
MDMA (ECSTASY) UR SCREEN: NOT DETECTED
METHADONE SCREEN, URINE: NOT DETECTED
Opiate, Ur Screen: NOT DETECTED
Phencyclidine (PCP) Ur S: NOT DETECTED
Tricyclic, Ur Screen: NOT DETECTED

## 2016-08-08 LAB — CBC
HEMATOCRIT: 41.7 % (ref 40.0–52.0)
Hemoglobin: 14.2 g/dL (ref 13.0–18.0)
MCH: 31 pg (ref 26.0–34.0)
MCHC: 34.1 g/dL (ref 32.0–36.0)
MCV: 91 fL (ref 80.0–100.0)
PLATELETS: 217 10*3/uL (ref 150–440)
RBC: 4.58 MIL/uL (ref 4.40–5.90)
RDW: 13.4 % (ref 11.5–14.5)
WBC: 16.9 10*3/uL — AB (ref 3.8–10.6)

## 2016-08-08 LAB — COMPREHENSIVE METABOLIC PANEL
ALK PHOS: 59 U/L (ref 38–126)
ALT: 16 U/L — AB (ref 17–63)
AST: 23 U/L (ref 15–41)
Albumin: 4.6 g/dL (ref 3.5–5.0)
Anion gap: 11 (ref 5–15)
BUN: 14 mg/dL (ref 6–20)
CALCIUM: 9.1 mg/dL (ref 8.9–10.3)
CO2: 23 mmol/L (ref 22–32)
CREATININE: 1 mg/dL (ref 0.61–1.24)
Chloride: 107 mmol/L (ref 101–111)
Glucose, Bld: 84 mg/dL (ref 65–99)
Potassium: 3.7 mmol/L (ref 3.5–5.1)
Sodium: 141 mmol/L (ref 135–145)
Total Bilirubin: 0.9 mg/dL (ref 0.3–1.2)
Total Protein: 7.6 g/dL (ref 6.5–8.1)

## 2016-08-08 LAB — ACETAMINOPHEN LEVEL

## 2016-08-08 LAB — ETHANOL: Alcohol, Ethyl (B): 12 mg/dL — ABNORMAL HIGH (ref <5)

## 2016-08-08 MED ORDER — ACETAMINOPHEN 500 MG PO TABS
1000.0000 mg | ORAL_TABLET | Freq: Once | ORAL | Status: AC
  Administered 2016-08-08: 1000 mg via ORAL
  Filled 2016-08-08: qty 2
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
## 2016-08-08 NOTE — ED Notes (Signed)

## 2016-08-08 NOTE — ED Notes (Signed)
Pt gives verbal permission for mom to have password

## 2016-08-08 NOTE — ED Notes (Signed)

## 2016-08-08 NOTE — ED Notes (Signed)
Soc called for consult   1640

## 2016-08-08 NOTE — ED Notes (Signed)
Report to Margaret, RN in ED BHU.  

## 2016-08-08 NOTE — ED Notes (Signed)
SOC in progress.  

## 2016-08-08 NOTE — ED Notes (Signed)
Pt escorted to ED BHU with EDT Waynetta SandyBeth and [employer] officer Ward.

## 2016-08-08 NOTE — ED Provider Notes (Signed)
[facility] Emergency Department Provider Note    First MD Initiated Contact with Patient 08/08/16 1519     (approximate)  I have reviewed the triage vital signs and the nursing notes.   HISTORY  Chief Complaint Psychiatric Evaluation    HPI Edward Romero is a 26 y.o. male presents with 3-4 weeks of worsening agitation and stress. Patient is reportedly snapping at his girlfriend and family members and is having thoughts of physically harming them. Patient states that he is feeling more stressed out due to expecting her newborn child. Patient states he does have a history of stress and his depression. Has not been on any medications. Does endorse recreational drug use but none today. He denies any SI or hallucinations.   Past Medical History:  Diagnosis Date  . Asthma   . Depression   . Diabetes mellitus without complication (HCC)   . Drug use   . Kidney stone    History reviewed. No pertinent family history. Past Surgical History:  Procedure Laterality Date  . KNEE SURGERY     Patient Active Problem List   Diagnosis Date Noted  . Subdural hemorrhage (HCC) 04/19/2016  . Closed head injury with concussion 04/19/2016      Prior to Admission medications   Medication Sig Start Date End Date Taking? Authorizing Provider  artificial tears (LACRILUBE) OINT ophthalmic ointment Place into both eyes every 4 (four) hours as needed for dry eyes. 04/28/16   Horton, Mayer Masker, MD  HYDROcodone-acetaminophen (NORCO/VICODIN) 5-325 MG tablet Take 1 tablet by mouth every 4 (four) hours as needed for moderate pain. 02/14/16   [name], MD  ondansetron (ZOFRAN ODT) 4 MG disintegrating tablet Take 1 tablet (4 mg total) by mouth every 8 (eight) hours as needed for nausea or vomiting. 02/14/16   [name], MD  ondansetron (ZOFRAN ODT) 4 MG disintegrating tablet Take 1 tablet (4 mg total) by mouth every 8 (eight) hours as needed for nausea or vomiting. 06/08/16    Rebecka Apley, MD  predniSONE (DELTASONE) 20 MG tablet Take 2 tablets (40 mg total) by mouth daily. 04/28/16   Horton, Mayer Masker, MD  traMADol (ULTRAM) 50 MG tablet Take 1 tablet (50 mg total) by mouth every 6 (six) hours as needed. Patient taking differently: Take 50 mg by mouth every 6 (six) hours as needed for moderate pain.  04/20/16   Barnett Abu, MD    Allergies Patient has no known allergies.    Social History Social History  Substance Use Topics  . Smoking status: Current Every Day Smoker  . Smokeless tobacco: Never Used  . Alcohol use Yes     Comment: occ    Review of Systems Patient denies headaches, rhinorrhea, blurry vision, numbness, shortness of breath, chest pain, edema, cough, abdominal pain, nausea, vomiting, diarrhea, dysuria, fevers, rashes or hallucinations unless otherwise stated above in HPI. ____________________________________________   PHYSICAL EXAM:  VITAL SIGNS: Vitals:   08/08/16 1457  BP: 128/72  Pulse: (!) 103  Resp: 16  Temp: 98.3 F (36.8 C)    Constitutional: Alert and oriented. Well appearing and in no acute distress. Eyes: Conjunctivae are normal.  Head: Atraumatic. Nose: No congestion/rhinnorhea. Mouth/Throat: Mucous membranes are moist.   Neck: No stridor. Painless ROM.  Cardiovascular: Normal rate, regular rhythm. Grossly normal heart sounds.  Good peripheral circulation. Respiratory: Normal respiratory effort.  No retractions. Lungs CTAB. Gastrointestinal: Soft and nontender. No distention. No abdominal bruits. No CVA tenderness. Musculoskeletal: No lower  extremity tenderness nor edema.  No joint effusions. Neurologic:  Normal speech and language. No gross focal neurologic deficits are appreciated. No facial droop Skin:  Skin is warm, dry and intact. No rash noted. Psychiatric: Mood is depressed and stressed, affect is withdrawal ____________________________________________   LABS (all labs ordered are listed, but only  abnormal results are displayed)  Results for orders placed or performed during the hospital encounter of 08/08/16 (from the past 24 hour(s))  Comprehensive metabolic panel     Status: Abnormal   Collection Time: 08/08/16  3:02 PM  Result Value Ref Range   Sodium 141 135 - 145 mmol/L   Potassium 3.7 3.5 - 5.1 mmol/L   Chloride 107 101 - 111 mmol/L   CO2 23 22 - 32 mmol/L   Glucose, Bld 84 65 - 99 mg/dL   BUN 14 6 - 20 mg/dL   Creatinine, Ser 1.611.00 0.61 - 1.24 mg/dL   Calcium 9.1 8.9 - 09.610.3 mg/dL   Total Protein 7.6 6.5 - 8.1 g/dL   Albumin 4.6 3.5 - 5.0 g/dL   AST 23 15 - 41 U/L   ALT 16 (L) 17 - 63 U/L   Alkaline Phosphatase 59 38 - 126 U/L   Total Bilirubin 0.9 0.3 - 1.2 mg/dL   GFR calc non Af Amer >60 >60 mL/min   GFR calc Af Amer >60 >60 mL/min   Anion gap 11 5 - 15  Ethanol     Status: Abnormal   Collection Time: 08/08/16  3:02 PM  Result Value Ref Range   Alcohol, Ethyl (B) 12 (H) <5 mg/dL  Salicylate level     Status: None   Collection Time: 08/08/16  3:02 PM  Result Value Ref Range   Salicylate Lvl <7.0 2.8 - 30.0 mg/dL  Acetaminophen level     Status: Abnormal   Collection Time: 08/08/16  3:02 PM  Result Value Ref Range   Acetaminophen (Tylenol), Serum <10 (L) 10 - 30 ug/mL  cbc     Status: Abnormal   Collection Time: 08/08/16  3:02 PM  Result Value Ref Range   WBC 16.9 (H) 3.8 - 10.6 K/uL   RBC 4.58 4.40 - 5.90 MIL/uL   Hemoglobin 14.2 13.0 - 18.0 g/dL   HCT 04.541.7 40.940.0 - 81.152.0 %   MCV 91.0 80.0 - 100.0 fL   MCH 31.0 26.0 - 34.0 pg   MCHC 34.1 32.0 - 36.0 g/dL   RDW 91.413.4 78.211.5 - 95.614.5 %   Platelets 217 150 - 440 K/uL   ____________________________________________  EKG My review and personal interpretation at Time: 15:57   Indication: med eval  Rate: 95  Rhythm: sinus Axis: normal Other: normal intervals, non specific st changes, no stemi ____________________________________________  RADIOLOGY  I personally reviewed all radiographic images ordered to  evaluate for the above acute complaints and reviewed radiology reports and findings.  These findings were personally discussed with the patient.  Please see medical record for radiology report.  ____________________________________________   PROCEDURES  Procedure(s) performed:  Procedures    Critical Care performed: no ____________________________________________   INITIAL IMPRESSION / ASSESSMENT AND PLAN / ED COURSE  Pertinent labs & imaging results that were available during my care of the patient were reviewed by me and considered in my medical decision making (see chart for details).  DDX: Psychosis, delirium, medication effect, noncompliance, polysubstance abuse, Si, Hi, depression   [place] Strickland is a 26 y.o. who presents to the ED with for evaluation of  HI and agitation.  Patient has psych history of depression.  CT head ordered to eval for ich given patient's history SDH.  CT head with NAICALaboratory testing was ordered to evaluation for underlying electrolyte derangement or signs of underlying organic pathology to explain today's presentation.  Based on history and physical and laboratory evaluation, it appears that the patient's presentation is 2/2 underlying psychiatric disorder and will require further evaluation and management by inpatient psychiatry.  Patient was  made an IVC due to HI and agitation.  Disposition pending psychiatric evaluation.       ____________________________________________   FINAL CLINICAL IMPRESSION(S) / ED DIAGNOSES  Final diagnoses:  Depression, unspecified depression type  Homicidal ideations      NEW MEDICATIONS STARTED DURING THIS VISIT:  New Prescriptions   No medications on file     Note:  This document was prepared using Dragon voice recognition software and may include unintentional dictation errors.    Willy Eddy, MD 08/08/16 [phone]

## 2016-08-08 NOTE — ED Notes (Signed)
Report was received Dorise HissElizabeth C., RN; Pt. Verbalizes  complaints of having a H/O Bipolar D/O; with increased irritability and mood swings; and distress; with stating, "I feel like I'm going to blow up; but I cannot explain why; it doesn't make any sense." denies S.I./Hi. Continue to monitor with 15 min. Monitoring.

## 2016-08-08 NOTE — ED Triage Notes (Signed)
Pt giving vague description of why here.  Denies SI. When asked about HI pt states "yes" but does not have plan and when asked who had thoughts of hurting state s"everyone".  Admits to child on way and being stressed out.  Unsure what he wants help with.  Reports snapping at people.

## 2016-08-08 NOTE — ED Notes (Signed)
Pt given supper tray.

## 2016-08-08 NOTE — BH Assessment (Signed)
[facility] by Dr. Joseph ArtSubedi.  Attending Physician will be Dr. Ardyth HarpsHernandez.   [facility] Mechele CollinValinda.   Intake Paper Work has been signed and placed on patient chart.  ER staff is aware of the admission Bonita Quin( [facility].; Dr. Roxan Hockeyobinson, ER MD; Claris CheMargaret Patient's Nurse & Marylene LandAngela Patient Access).

## 2016-08-08 NOTE — ED Notes (Signed)
Pt "dressed out" in triage. Items bagged and tagged = one pair shoes, one pair socks, one pair pants, one shirt, one lighter, one wallet, one cell phone charger.

## 2016-08-09 ENCOUNTER — Inpatient Hospital Stay: Admission: AD | Admit: 2016-08-09 | Payer: No Typology Code available for payment source | Admitting: Psychiatry

## 2016-08-09 ENCOUNTER — Inpatient Hospital Stay
Admission: RE | Admit: 2016-08-09 | Discharge: 2016-08-11 | DRG: 885 | Disposition: A | Discharge status: Home / Self Care | Payer: No Typology Code available for payment source | Source: Intra-hospital | Attending: Psychiatry | Admitting: Psychiatry

## 2016-08-09 ENCOUNTER — Encounter: Payer: Self-pay | Admitting: Psychiatry

## 2016-08-09 DIAGNOSIS — F4310 Post-traumatic stress disorder, unspecified: Secondary | ICD-10-CM

## 2016-08-09 DIAGNOSIS — F1220 Cannabis dependence, uncomplicated: Secondary | ICD-10-CM

## 2016-08-09 DIAGNOSIS — Z7952 Long term (current) use of systemic steroids: Secondary | ICD-10-CM | POA: Diagnosis not present

## 2016-08-09 DIAGNOSIS — F332 Major depressive disorder, recurrent severe without psychotic features: Principal | ICD-10-CM | POA: Diagnosis present

## 2016-08-09 DIAGNOSIS — Z79899 Other long term (current) drug therapy: Secondary | ICD-10-CM

## 2016-08-09 DIAGNOSIS — F17210 Nicotine dependence, cigarettes, uncomplicated: Secondary | ICD-10-CM | POA: Diagnosis present

## 2016-08-09 DIAGNOSIS — G4700 Insomnia, unspecified: Secondary | ICD-10-CM | POA: Diagnosis present

## 2016-08-09 DIAGNOSIS — R4587 Impulsiveness: Secondary | ICD-10-CM | POA: Diagnosis present

## 2016-08-09 LAB — LIPID PANEL
Cholesterol: 98 mg/dL (ref 0–200)
HDL: 37 mg/dL — AB (ref >40)
LDL CALC: 43 mg/dL (ref 0–99)
TRIGLYCERIDES: 89 mg/dL (ref <150)
Total CHOL/HDL Ratio: 2.6 RATIO
VLDL: 18 mg/dL (ref 0–40)

## 2016-08-09 LAB — VITAMIN B12: VITAMIN B 12: 316 pg/mL (ref 180–914)

## 2016-08-09 LAB — TSH: TSH: 0.184 u[IU]/mL — ABNORMAL LOW (ref 0.350–4.500)

## 2016-08-09 MED ORDER — TRAZODONE HCL 50 MG PO TABS: 50.0000 mg | ORAL_TABLET | Freq: Every evening | ORAL | Status: DC | PRN

## 2016-08-09 MED ORDER — MAGNESIUM HYDROXIDE 400 MG/5ML PO SUSP: 30.0000 mL | Freq: Every day | ORAL | Status: DC | PRN

## 2016-08-09 MED ORDER — ARTIFICIAL TEARS OP OINT
TOPICAL_OINTMENT | OPHTHALMIC | Status: DC | PRN
  Filled 2016-08-09: qty 3.5

## 2016-08-09 MED ORDER — ONDANSETRON 4 MG PO TBDP: 4.0000 mg | ORAL_TABLET | Freq: Three times a day (TID) | ORAL | Status: DC | PRN

## 2016-08-09 MED ORDER — ACETAMINOPHEN 325 MG PO TABS: 650.0000 mg | ORAL_TABLET | Freq: Four times a day (QID) | ORAL | Status: DC | PRN

## 2016-08-09 MED ORDER — ALUM & MAG HYDROXIDE-SIMETH 200-200-20 MG/5ML PO SUSP: 30.0000 mL | ORAL | Status: DC | PRN

## 2016-08-09 MED ORDER — NICOTINE 21 MG/24HR TD PT24
21.0000 mg | MEDICATED_PATCH | Freq: Every day | TRANSDERMAL | Status: DC
  Administered 2016-08-09 – 2016-08-10 (×2): 21 mg via TRANSDERMAL
  Filled 2016-08-09 (×2): qty 1

## 2016-08-09 MED ORDER — ENSURE ENLIVE PO LIQD
237.0000 mL | Freq: Two times a day (BID) | ORAL | Status: DC
  Administered 2016-08-10 – 2016-08-11 (×3): 237 mL via ORAL

## 2016-08-09 MED ORDER — PREDNISONE 20 MG PO TABS
40.0000 mg | ORAL_TABLET | Freq: Every day | ORAL | Status: DC
  Filled 2016-08-09 (×2): qty 2

## 2016-08-09 MED ORDER — TRAMADOL HCL 50 MG PO TABS: 50.0000 mg | ORAL_TABLET | Freq: Four times a day (QID) | ORAL | Status: DC | PRN

## 2016-08-09 MED ORDER — FLUOXETINE HCL 10 MG PO CAPS
10.0000 mg | ORAL_CAPSULE | Freq: Every day | ORAL | Status: DC
  Administered 2016-08-09 – 2016-08-11 (×3): 10 mg via ORAL
  Filled 2016-08-09 (×3): qty 1

## 2016-08-09 MED ORDER — TRAZODONE HCL 50 MG PO TABS
50.0000 mg | ORAL_TABLET | Freq: Every day | ORAL | Status: DC
  Administered 2016-08-09 – 2016-08-10 (×2): 50 mg via ORAL
  Filled 2016-08-09 (×2): qty 1

## 2016-08-09 MED ORDER — HYDROXYZINE HCL 25 MG PO TABS
25.0000 mg | ORAL_TABLET | Freq: Three times a day (TID) | ORAL | Status: DC | PRN
  Administered 2016-08-09 – 2016-08-10 (×3): 25 mg via ORAL
  Filled 2016-08-09 (×2): qty 1

## 2016-08-09 NOTE — Tx Team (Signed)
Initial Treatment Plan 08/09/2016 4:02 AM Antonieta Pertakota Lee Odonell [id number]    PATIENT STRESSORS: Marital or family conflict Medication change or noncompliance Substance abuse   PATIENT STRENGTHS: Ability for insight Average or above average intelligence Communication skills General fund of knowledge Physical Health   PATIENT IDENTIFIED PROBLEMS: Depression  anxiety                   DISCHARGE CRITERIA:  Ability to meet basic life and health needs Improved stabilization in mood, thinking, and/or behavior Motivation to continue treatment in a less acute level of care Verbal commitment to aftercare and medication compliance  PRELIMINARY DISCHARGE PLAN: Participate in family therapy Return to previous living arrangement Return to previous work or school arrangements  PATIENT/FAMILY INVOLVEMENT: This treatment plan has been presented to and reviewed with the patient, Edward Romero, .  The patient has been given the opportunity to ask questions and make suggestions.  Olin PiaByungura, Armaan Pond, RN 08/09/2016, 4:02 AM

## 2016-08-09 NOTE — Progress Notes (Signed)
   08/09/16 1100  Clinical Encounter Type  Visited With Patient;Health care provider  Visit Type Initial;Spiritual support;Behavioral Health;Other (Comment) (Treatment team)   CH was present during treatment team meeting with patient. CH will visit patient to assist with his goal of reducing stress and learning coping skills.

## 2016-08-09 NOTE — Plan of Care (Signed)
Problem: Education: Goal: Utilization of techniques to improve thought processes will improve Outcome: Not Progressing Not discussed

## 2016-08-09 NOTE — Plan of Care (Signed)
Problem: Coping: Goal: Ability to cope will improve Outcome: Progressing Cooperative but expressing depression

## 2016-08-09 NOTE — Plan of Care (Signed)
Problem: Health Behavior/Discharge Planning: Goal: Compliance with therapeutic regimen will improve Outcome: Not Progressing Not discussed. Newly admitted

## 2016-08-09 NOTE — Progress Notes (Signed)
Pt currently in bed resting after taking a shower. No sign of discomfort. Was encouraged to talk to staff as needed. Safety precautions maintained.

## 2016-08-09 NOTE — Plan of Care (Signed)
Problem: Education: Goal: Mental status will improve Outcome: Progressing Appropriately interacts with staff/peers on unit, participates, is pleasant and cooperative.

## 2016-08-09 NOTE — Plan of Care (Signed)
Problem: Activity: Goal: Interest or engagement in leisure activities will improve Outcome: Progressing Patient willing to attend groups and other activities

## 2016-08-09 NOTE — Tx Team (Signed)
Interdisciplinary Treatment and Diagnostic Plan Update  08/09/2016 Time of Session: [address] MRN: [id number]  Principal Diagnosis: <principal problem not specified>  Secondary Diagnoses: Active Problems:   Severe recurrent major depression (HCC)   Current Medications:  Current Facility-Administered Medications  Medication Dose Route Frequency Provider Last Rate Last Dose  . acetaminophen (TYLENOL) tablet 650 mg  650 mg Oral Q6H PRN Lenward Chancellor, MD      . alum & mag hydroxide-simeth (MAALOX/MYLANTA) 200-200-20 MG/5ML suspension 30 mL  30 mL Oral Q4H PRN Lenward Chancellor, MD      . artificial tears (LACRILUBE) ophthalmic ointment   Both Eyes Q4H PRN Lenward Chancellor, MD      . FLUoxetine (PROZAC) capsule 10 mg  10 mg Oral Daily Hildred Priest, MD      . hydrOXYzine (ATARAX/VISTARIL) tablet 25 mg  25 mg Oral TID PRN Lenward Chancellor, MD      . magnesium hydroxide (MILK OF MAGNESIA) suspension 30 mL  30 mL Oral Daily PRN Lenward Chancellor, MD      . nicotine (NICODERM CQ - dosed in mg/24 hours) patch 21 mg  21 mg Transdermal Daily Hildred Priest, MD      . ondansetron (ZOFRAN-ODT) disintegrating tablet 4 mg  4 mg Oral Q8H PRN Lenward Chancellor, MD      . traZODone (DESYREL) tablet 50 mg  50 mg Oral QHS Hildred Priest, MD       PTA Medications: Prescriptions Prior to Admission  Medication Sig Dispense Refill Last Dose  . artificial tears (LACRILUBE) OINT ophthalmic ointment Place into both eyes every 4 (four) hours as needed for dry eyes. 3.5 g 0   . HYDROcodone-acetaminophen (NORCO/VICODIN) 5-325 MG tablet Take 1 tablet by mouth every 4 (four) hours as needed for moderate pain. 20 tablet 0 unk  . ondansetron (ZOFRAN ODT) 4 MG disintegrating tablet Take 1 tablet (4 mg total) by mouth every 8 (eight) hours as needed for nausea or vomiting. 20 tablet 0 unk  . ondansetron (ZOFRAN ODT) 4 MG disintegrating tablet Take 1 tablet (4 mg total)  by mouth every 8 (eight) hours as needed for nausea or vomiting. 20 tablet 0   . predniSONE (DELTASONE) 20 MG tablet Take 2 tablets (40 mg total) by mouth daily. 14 tablet 0   . traMADol (ULTRAM) 50 MG tablet Take 1 tablet (50 mg total) by mouth every 6 (six) hours as needed. (Patient taking differently: Take 50 mg by mouth every 6 (six) hours as needed for moderate pain. ) 40 tablet 1 Past Week at Unknown time    Patient Stressors: Marital or family conflict Medication change or noncompliance Substance abuse  Patient Strengths: Ability for insight Average or above average intelligence Communication skills General fund of knowledge Physical Health  Treatment Modalities: Medication Management, Group therapy, Case management,  1 to 1 session with clinician, Psychoeducation, Recreational therapy.   Physician Treatment Plan for Primary Diagnosis: <principal problem not specified> Long Term Goal(s):     Short Term Goals:    Medication Management: Evaluate patient's response, side effects, and tolerance of medication regimen.  Therapeutic Interventions: 1 to 1 sessions, Unit Group sessions and Medication administration.  Evaluation of Outcomes: Not Met  Physician Treatment Plan for Secondary Diagnosis: Active Problems:   Severe recurrent major depression ([place])  Long Term Goal(s):     Short Term Goals:       Medication Management: Evaluate patient's response, side effects, and tolerance of medication regimen.  Therapeutic Interventions: 1 to  1 sessions, Unit Group sessions and Medication administration.  Evaluation of Outcomes: Not Met   RN Treatment Plan for Primary Diagnosis: <principal problem not specified> Long Term Goal(s): Knowledge of disease and therapeutic regimen to maintain health will improve  Short Term Goals: Ability to demonstrate self-control, Ability to verbalize feelings will improve and Ability to identify and develop effective coping behaviors will  improve  Medication Management: RN will administer medications as ordered by provider, will assess and evaluate patient's response and provide education to patient for prescribed medication. RN will report any adverse and/or side effects to prescribing provider.  Therapeutic Interventions: 1 on 1 counseling sessions, Psychoeducation, Medication administration, Evaluate responses to treatment, Monitor vital signs and CBGs as ordered, Perform/monitor CIWA, COWS, AIMS and Fall Risk screenings as ordered, Perform wound care treatments as ordered.  Evaluation of Outcomes: Not Met   LCSW Treatment Plan for Primary Diagnosis: <principal problem not specified> Long Term Goal(s): Safe transition to appropriate next level of care at discharge, Engage patient in therapeutic group addressing interpersonal concerns.  Short Term Goals: Engage patient in aftercare planning with referrals and resources, Increase social support and Increase skills for wellness and recovery  Therapeutic Interventions: Assess for all discharge needs, 1 to 1 time with Social worker, Explore available resources and support systems, Assess for adequacy in community support network, Educate family and significant other(s) on suicide prevention, Complete Psychosocial Assessment, Interpersonal group therapy.  Evaluation of Outcomes: Not Met   Progress in Treatment: Attending groups: Yes. Participating in groups: Yes. Taking medication as prescribed: Yes. Toleration medication: Yes. Family/Significant other contact made: No, will contact:  mother Patient understands diagnosis: Yes. Discussing patient identified problems/goals with staff: Yes. Medical problems stabilized or resolved: Yes. Denies suicidal/homicidal ideation: Yes. Issues/concerns per patient self-inventory: No. Other: none  New problem(s) identified: No, Describe:  none  New Short Term/Long Term Goal(s):Pt goal: "Try to figure out why I'm so on edge.  Figure  out my actions. I've been snapping and that's not me."  Discharge Plan or Barriers: CSW assessing for appropriate plan.  Reason for Continuation of Hospitalization: Depression Medication stabilization  Estimated Length of Stay: 3-5 days.  Attendees: Patient: Edward Romero 08/09/2016   Physician: Dr. Jerilee Hoh, MD 08/09/2016   Nursing: Geraldo Pitter, RN 08/09/2016   RN Care Manager: 08/09/2016   Social Worker: Lurline Idol, LCSW 08/09/2016   Recreational Therapist:  08/09/2016   Other: Drue Dun, Chaplain 08/09/2016   Other:  08/09/2016   Other: 08/09/2016        Scribe for Treatment Team: Joanne Chars, [place] 08/09/2016 3:32 PM

## 2016-08-09 NOTE — Plan of Care (Signed)
Problem: Coping: Goal: Ability to verbalize frustrations and anger appropriately will improve Outcome: Progressing No angry outbursts observed this shift. Appropriately interacts with staff/peers.

## 2016-08-09 NOTE — Progress Notes (Signed)
Pt unsure of why he would taking prednisone as ordered, see MAR. From the chart, it looks as if he was prescribed prednisone back in march, but the patient reports he does not take this medication currently at home. Will hold the medication and discuss with doctor. Safety maintained. Will continue to monitor.

## 2016-08-09 NOTE — Plan of Care (Signed)
Problem: Coping: Goal: Ability to demonstrate self-control will improve Outcome: Progressing Pt able to express his thoughts, feelings and needs

## 2016-08-09 NOTE — Plan of Care (Signed)
Problem: Safety: Goal: Periods of time without injury will increase Outcome: Progressing Safety precautions initiated

## 2016-08-09 NOTE — Plan of Care (Signed)
Problem: Coping: Goal: Ability to verbalize feelings will improve Outcome: Progressing Pt able to express his feelings as needed

## 2016-08-09 NOTE — [facility] Suicide Risk Assessment (Signed)
[facility]   Nursing information obtained from:    Demographic factors:    Current Mental Status:    Loss Factors:    Historical Factors:    Risk Reduction Factors:     Total Time spent with patient: 1 hour Principal Problem: Severe recurrent major depression (HCC) Diagnosis:   Patient Active Problem List   Diagnosis Date Noted  . Severe recurrent major depression (HCC) [F33.2] 08/09/2016  . PTSD (post-traumatic stress disorder) [F43.10] 08/09/2016  . Cannabis use disorder, severe, dependence (HCC) [F12.20] 08/09/2016   Subjective Data:   Continued Clinical Symptoms:  Alcohol Use Disorder Identification Test Final Score (AUDIT): 7 The "Alcohol Use Disorders Identification Test", Guidelines for Use in Primary Care, Second Edition.  World Science [facility] [facility](WHO). Score between 0-7:  no or low risk or alcohol related problems. Score between 8-15:  moderate risk of alcohol related problems. Score between 16-19:  high risk of alcohol related problems. Score 20 or above:  warrants further diagnostic evaluation for alcohol dependence and treatment.   CLINICAL FACTORS:   Depression:   Impulsivity Insomnia Alcohol/Substance Abuse/Dependencies More than one psychiatric diagnosis Previous Psychiatric Diagnoses and Treatments     Psychiatric Specialty Exam: Physical Exam  Constitutional: He appears well-developed and well-nourished.  HENT:  Head: Normocephalic and atraumatic.    ROS  Blood pressure 115/71, pulse 74, temperature 98.2 F (36.8 C), temperature source Oral, resp. rate 18, height 6' (1.829 m), weight 65.3 kg (144 lb), SpO2 99 %.Body mass index is 19.53 kg/m.                                                    Sleep:  Number of Hours: 1      COGNITIVE FEATURES THAT CONTRIBUTE TO RISK:  Polarized thinking    SUICIDE RISK:   Moderate:  Frequent suicidal ideation with limited intensity, and duration, some  specificity in terms of plans, no associated intent, good self-control, limited dysphoria/symptomatology, some risk factors present, and identifiable protective factors, including available and accessible social support.  PLAN OF CARE: admit   I certify that inpatient services furnished can reasonably be expected to improve the patient's condition.   Jimmy FootmanHernandez-Gonzalez,  Juvon Teater, MD 08/09/2016, 6:03 PM

## 2016-08-09 NOTE — Progress Notes (Signed)
NUTRITION ASSESSMENT  Pt identified as at risk on the Malnutrition Screen Tool  INTERVENTION: 1. Educated patient on the importance of nutrition and encouraged intake of food and beverages. 2. Discussed weight goals. 3. Supplements: Ensure Enlive po BID, each supplement provides 350 kcal and 20 grams of protein  NUTRITION DIAGNOSIS: Unintentional weight loss related to sub-optimal intake as evidenced by pt report.   Goal: Pt to meet >/= 90% of their estimated nutrition needs.  Monitor:  PO intake  Assessment:  [age] y.o. male with PMHx of asthma, depression, drug use who presented due to increased agitation and irritability.  Patient reports he has not been eating well due to stress at home. He reports he has only been eating one meal per day and it is usually a biscuit before work. He then works a long work day. Has not been sleeping well. Does not eat any other meals or snacks during the day. Denies any N/V, abdominal pain. Patient reports he enjoys Ensure and was drinking a similar protein shake at home. He would like to drink them here. He reports he did not eat his lunch because he accidentally ordered a salad and it was not good. He will attempt to eat dinner tonight.  Patient reports his UBW was 155 lbs. He reports he has lost 11 lbs (7.1% body weight) over unknown time period. Weight history in chart is likely not reliable as all of the weights appear to be stated and not true measured weights.  Height: Ht Readings from Last 1 Encounters:  08/09/16 6' (1.829 m)    Weight: Wt Readings from Last 1 Encounters:  08/09/16 144 lb (65.3 kg)    Weight Hx: Wt Readings from Last 10 Encounters:  08/09/16 144 lb (65.3 kg)  08/08/16 150 lb (68 kg)  06/08/16 140 lb (63.5 kg)  06/07/16 140 lb (63.5 kg)  04/27/16 136 lb (61.7 kg)  04/19/16 175 lb (79.4 kg)  02/14/16 140 lb (63.5 kg)  03/19/15 140 lb (63.5 kg)    BMI:  Body mass index is 19.53 kg/m. Pt meets criteria for normal  weight based on current BMI.  Estimated Nutritional Needs: Kcal: 25-30 kcal/kg Protein: > 1 gram protein/kg Fluid: 1 ml/kcal  Diet Order: Diet regular Room service appropriate? Yes; Fluid consistency: Thin Pt is also offered choice of unit snacks mid-morning and mid-afternoon.  Pt is eating as desired.   Lab results and medications reviewed.   Helane RimaLeanne Reilley Latorre, MS, RD, LDN Pager: [phone] After Hours Pager: [phone]
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
## 2016-08-09 NOTE — Plan of Care (Signed)
Problem: Education: Goal: Verbalization of understanding the information provided will improve Outcome: Progressing Pt educated about his mental health care needs. Verbalized understanding

## 2016-08-09 NOTE — Progress Notes (Signed)
   08/09/16 1030  Clinical Encounter Type  Visited With Patient  Visit Type Initial;Spiritual support;Behavioral Health  [facility]   CH visited with patient as he was a new admit to the unit since her last shift. Patient stated he was there due to stress. CH invited the patient to attend groups. Goal for the patient and CH was to work on his coping with varying views of who is in charge of his life. Patient stated he has anger issues, and likes to play cards and dominos. CH will follow-up with patient at a later time.

## 2016-08-09 NOTE — [facility] Group Notes (Signed)
[facility] LCSW Group Therapy   08/09/2016 9:30 am Type of Therapy: Group Therapy   Participation Level: Active   Participation Quality: Attentive, Sharing and Supportive   Affect: Appropriate  Cognitive: Alert and Oriented   Insight: Developing/Improving and Engaged   Engagement in Therapy: Developing/Improving and Engaged   Modes of Intervention: Clarification, Confrontation, Discussion, Education, Exploration,  Limit-setting, Orientation, Problem-solving, Rapport Building, Dance movement psychotherapisteality Testing, Socialization and Support   Summary of Progress/Problems: Pt identified obstacles faced currently and processed barriers involved in overcoming these obstacles. Pt identified steps necessary for overcoming these obstacles and explored motivation (internal and external) for facing these difficulties head on. Pt further identified one area of concern in their lives and chose a goal to focus on for today. Pt identified emotions that led to hospitalization. He stated a change plan to regulate negative emotions. Pt also identified coping mechanisms to implement in order to reduce negative emotions. CSW provided support to patient.  Hampton AbbotKadijah Eira Alpert, MSW, LCSW-A 08/09/2016, 10:58AM

## 2016-08-09 NOTE — Progress Notes (Signed)
Pt awake, alert, pleasant and cooperative. Denies SI/HI/AVH, pain. Attends groups and participates. Appropriately interacts with staff/peers. Appears anxious, worried. Voices concern regarding recent feelings of agitation and stress that caused the patient to react inappropriately, "like throw my cell phone and break it, break things, expect and argument everyday when I get home from work." Support and encouragement provided. Safety maintained with every 15 minute checks. Will continue to monitor.

## 2016-08-10 MED ORDER — TRAZODONE HCL 50 MG PO TABS: 50.0000 mg | ORAL_TABLET | Freq: Every evening | ORAL | 0 refills | Status: AC | PRN

## 2016-08-10 MED ORDER — FLUOXETINE HCL 10 MG PO CAPS: 10.0000 mg | ORAL_CAPSULE | Freq: Every day | ORAL | 0 refills | Status: AC

## 2016-08-10 NOTE — Plan of Care (Signed)
Problem: Coping: Goal: Ability to verbalize feelings will improve Outcome: Progressing Patient verbalized feelings to staff.    

## 2016-08-10 NOTE — Progress Notes (Signed)
D: Pt denies SI/HI/AVH, affect is flat but brightens upon approach. Pt is  pleasant and cooperative with treatment plan. Pt appears less anxious and he is interacting with peers and staff appropriately.  A: Pt was offered support and encouragement. Pt was offered scheduled medications. Pt was encouraged to attend groups. Q 15 minute checks were done for safety.  R:Pt attends groups and interacts well with peers and staff. Pt is taking medication. Pt is receptive to treatment and safety maintained on unit.

## 2016-08-10 NOTE — [facility] Group Notes (Signed)
[facility]  Date/Time: 08/10/2016, 9:30 AM  Type of Therapy/Topic:  Group Therapy:  Feelings about Diagnosis  Participation Level:  Did Not Attend    Hampton AbbotKadijah Johnelle Tafolla, MSW, LCSW-A 08/10/2016, 10:37AM

## 2016-08-10 NOTE — [facility] Suicide Risk Assessment (Addendum)
[facility] INPATIENT:  Family/Significant Other Suicide Prevention Education  Suicide Prevention Education:  Contact Attempts: [name] Toney/[place], [phone], has been identified by the patient as the family member/significant other with whom the patient will be residing, and identified as the person(s) who will aid the patient in the event of a mental health crisis.  With written consent from the patient, two attempts were made to provide suicide prevention education, prior to and/or following the patient's discharge.  We were unsuccessful in providing suicide prevention education.  A suicide education pamphlet was given to the patient to share with family/significant other.  Date and time of first attempt:08/10/16, [id number] Date and time of second attempt: 08/11/16, 1007  [name], Jesstin Studstill Jon, LCSW 08/10/2016, 1:33 PM

## 2016-08-10 NOTE — [facility] Group Notes (Signed)
[facility] Group Notes:  (Nursing/MHT/Case Management/Adjunct)  Date:  08/10/2016  Time:  2:41 PM  Type of Therapy:  Psychoeducational Skills  Participation Level:  Active  Participation Quality:  Appropriate  Affect:  Appropriate  Cognitive:  Appropriate  Insight:  Appropriate  Engagement in Group:  Engaged  Modes of Intervention:  Socialization  Summary of Progress/Problems:  Mickey Farberamela M Moses Ellison 08/10/2016, 2:41 PM

## 2016-08-10 NOTE — Plan of Care (Signed)
Problem: Activity: Goal: Interest or engagement in leisure activities will improve Outcome: Progressing Socializes appropriately with staff/peers. Attends group.
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
## 2016-08-10 NOTE — Progress Notes (Signed)
Pt appropriately interacts on unit with staff/peers. Attends groups. Denies SI/HI/AVH, pain. Pt is medication compliant. Appears anxious at times, hangs head down when approached, but is bright and opens up. Support and encouragement provided. Medications administered as ordered with education. Safety maintained with every 15 minute checks. Will continue to monitor.

## 2016-08-10 NOTE — Progress Notes (Signed)
CSW spoke to pt about potential discharge tomorrow.  Pt does not think it is a good idea to return to live with his girlfriend and is planning to call a friend tonight about possibly staying with the friend temporarily.  Pt needs 2-4 weeks, he thinks, to save up money to get his own apartment and staying with the friend would help get started with that.  Once pt figures out what his options are, [facility] so that Edward Romero can pick him up on Friday for his follow up appt. Garner NashGregory Nizhoni Romero, MSW, LCSW Clinical Social Worker 08/10/2016 3:01 PM
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
## 2016-08-10 NOTE — Plan of Care (Signed)
Problem: Coping: Goal: Ability to cope will improve Outcome: Progressing No behavioral issues observed/reported. Denies SI/HI/AVH. Appropriately interacts on unit.

## 2016-08-10 NOTE — Plan of Care (Signed)
Problem: Coping: Goal: Ability to verbalize frustrations and anger appropriately will improve Outcome: Progressing Pt stated he was upset about doing things for his ex that was stressing him out.

## 2016-08-11 LAB — THYROID PANEL WITH TSH
FREE THYROXINE INDEX: 1.9 (ref 1.2–4.9)
T3 UPTAKE RATIO: 30 % (ref 24–39)
T4, Total: 6.2 ug/dL (ref 4.5–12.0)
TSH: 0.475 u[IU]/mL (ref 0.450–4.500)

## 2016-08-11 NOTE — [facility] Suicide Risk Assessment (Signed)
[facility] INPATIENT:  Family/Significant Other Suicide Prevention Education  Suicide Prevention Education:  Education Completed; Edward Romero, [phone] has been identified by the patient as the family member/significant other with whom the patient will be residing, and identified as the person(s) who will aid the patient in the event of a mental health crisis (suicidal ideations/suicide attempt).  With written consent from the patient, the family member/significant other has been provided the following suicide prevention education, prior to the and/or following the discharge of the patient.  The suicide prevention education provided includes the following:  Suicide risk factors  Suicide prevention and interventions  National Suicide Hotline telephone number  [facility] assessment telephone number  [facility] Emergency Assistance 911  [facility] and/or Residential Mobile Crisis Unit telephone number  Request made of family/significant other to:  Remove weapons (e.g., guns, rifles, knives), all items previously/currently identified as safety concern.  No guns that Edward SchwabMisty is aware of.  Remove drugs/medications (over-the-counter, prescriptions, illicit drugs), all items previously/currently identified as a safety concern.  The family member/significant other verbalizes understanding of the suicide prevention education information provided.  The family member/significant other agrees to remove the items of safety concern listed above.  Edward called back after pt was already discharged.  She is currently out of town but returning later today.  She has already spoken to Edward Romero and actually arranged for a friend to pick him up.  She will be meeting him later tonight and helping him with his housing situation.  Edward Romero, Edward Romero Jon, LCSW 08/11/2016, 2:48 PM

## 2016-08-11 NOTE — Progress Notes (Signed)
  [facility] :  Will you be returning to the same living situation after discharge:  No. Pt going to be staying with a friend.  Pt reports he will restart work on 7/19 and then will have money to pay for a hotel.   At discharge, do you have transportation home?: No. Bus pass provided. Do you have the ability to pay for your medications: No. Medication Management referral made.  Release of information consent forms completed and in the chart;  Patient's signature needed at discharge.  Patient to Follow up at: Follow-up Information    [facility] Follow up on 08/13/2016.   Why:  Unk PintoHarvey Bryant from [facility] will pick you up to initiate peer support services on Friday, 08/13/16, at 7:00am.  Please bring a copy of your hospital discharge paperwork. Contact information: [address] [place] [id number] [phone]           Next level of care provider has access to [facility] Link:no  Safety Planning and Suicide Prevention discussed: No. Attempts made.  Have you used any form of tobacco in the last 30 days? (Cigarettes, Smokeless Tobacco, Cigars, and/or Pipes): Yes  Has patient been referred to the Quitline?: Patient refused referral  Patient has been referred for addiction treatment: Yes     [name], Kiylah Loyer Jon, LCSW 08/11/2016, 11:23 AM
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
## 2016-08-11 NOTE — Progress Notes (Signed)
Pt denies SI, HI, a/v hallucinations. Pt commits to safety for discharge. Follow up appointments, discharge medications, and treatment reviewed. Pt verbalized understanding of discharge instructions. All belongings returned to pt upon discharge. Pt given 7 day supply of medications. Pt walked to courtesy vehicle and given bus pass.

## 2016-08-11 NOTE — [facility] Group Notes (Signed)
[facility] LCSW Group Therapy   08/11/2016 9:30 am Type of Therapy: Group Therapy   Participation Level: Active   Participation Quality: Attentive, Sharing and Supportive   Affect: Appropriate   Cognitive: Alert and Oriented   Insight: Developing/Improving and Engaged   Engagement in Therapy: Developing/Improving and Engaged   Modes of Intervention: Clarification, Confrontation, Discussion, Education, Exploration,  Limit-setting, Orientation, Problem-solving, Rapport Building, Dance movement psychotherapisteality Testing, Socialization and Support   Summary of Progress/Problems: Pt identified obstacles faced currently and processed barriers involved in overcoming these obstacles. Pt identified steps necessary for overcoming these obstacles and explored motivation (internal and external) for facing these difficulties head on. Pt further identified one area of concern in their lives and chose a goal to focus on for today. Patient defined the term "obstacle" and current obstacles that led to hospitalization. Patient identified what he needs in order to overcome his current obstacles. CSW provided support to patient.  Edward AbbotKadijah Jamerion Romero, MSW, LCSW-A 08/11/2016, 10:37AM

## 2016-08-11 NOTE — Progress Notes (Signed)
CSW faxed referral to [facility] at pt request.  Spoke with Coralie Carpenorrie, RN, who advised pt should call [facility] to schedule appt. Garner NashGregory Shaunta Oncale, MSW, LCSW Clinical Social Worker 08/11/2016 11:45 AM

## 2017-04-23 ENCOUNTER — Emergency Department

## 2017-04-23 ENCOUNTER — Encounter: Payer: Self-pay | Admitting: Emergency Medicine

## 2017-04-23 ENCOUNTER — Other Ambulatory Visit: Payer: Self-pay

## 2017-04-23 DIAGNOSIS — S022XXA Fracture of nasal bones, initial encounter for closed fracture: Secondary | ICD-10-CM | POA: Diagnosis not present

## 2017-04-23 DIAGNOSIS — S0181XA Laceration without foreign body of other part of head, initial encounter: Secondary | ICD-10-CM | POA: Diagnosis not present

## 2017-04-23 DIAGNOSIS — Y999 Unspecified external cause status: Secondary | ICD-10-CM | POA: Diagnosis not present

## 2017-04-23 DIAGNOSIS — S0992XA Unspecified injury of nose, initial encounter: Secondary | ICD-10-CM | POA: Diagnosis present

## 2017-04-23 DIAGNOSIS — J45909 Unspecified asthma, uncomplicated: Secondary | ICD-10-CM | POA: Insufficient documentation

## 2017-04-23 DIAGNOSIS — Y92149 Unspecified place in prison as the place of occurrence of the external cause: Secondary | ICD-10-CM | POA: Insufficient documentation

## 2017-04-23 DIAGNOSIS — Z23 Encounter for immunization: Secondary | ICD-10-CM | POA: Diagnosis not present

## 2017-04-23 DIAGNOSIS — Z87891 Personal history of nicotine dependence: Secondary | ICD-10-CM | POA: Insufficient documentation

## 2017-04-23 DIAGNOSIS — Y939 Activity, unspecified: Secondary | ICD-10-CM | POA: Insufficient documentation

## 2017-04-23 DIAGNOSIS — Z79899 Other long term (current) drug therapy: Secondary | ICD-10-CM | POA: Diagnosis not present
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
## 2017-04-24 ENCOUNTER — Emergency Department
Admission: EM | Admit: 2017-04-24 | Discharge: 2017-04-24 | Disposition: A | Discharge status: Home / Self Care | Attending: Emergency Medicine | Admitting: Emergency Medicine

## 2017-04-24 DIAGNOSIS — S0181XA Laceration without foreign body of other part of head, initial encounter: Secondary | ICD-10-CM

## 2017-04-24 DIAGNOSIS — S022XXA Fracture of nasal bones, initial encounter for closed fracture: Secondary | ICD-10-CM

## 2017-04-24 MED ORDER — LIDOCAINE-EPINEPHRINE (PF) 1 %-1:200000 IJ SOLN
INTRAMUSCULAR | Status: AC
  Administered 2017-04-24: 30 mL
  Filled 2017-04-24: qty 30

## 2017-04-24 MED ORDER — OXYCODONE-ACETAMINOPHEN 5-325 MG PO TABS
2.0000 | ORAL_TABLET | Freq: Once | ORAL | Status: AC
  Administered 2017-04-24: 2 via ORAL
  Filled 2017-04-24: qty 2

## 2017-04-24 MED ORDER — TETANUS-DIPHTH-ACELL PERTUSSIS 5-2.5-18.5 LF-MCG/0.5 IM SUSP
0.5000 mL | Freq: Once | INTRAMUSCULAR | Status: AC
  Administered 2017-04-24: 0.5 mL via INTRAMUSCULAR
  Filled 2017-04-24: qty 0.5

## 2017-04-24 MED ORDER — LIDOCAINE-EPINEPHRINE (PF) 2 %-1:200000 IJ SOLN: 10.0000 mL | Freq: Once | INTRAMUSCULAR | Status: DC

## 2017-04-24 MED ORDER — IBUPROFEN 600 MG PO TABS
600.0000 mg | ORAL_TABLET | Freq: Once | ORAL | Status: AC
  Administered 2017-04-24: 600 mg via ORAL
  Filled 2017-04-24: qty 1

## 2017-04-24 MED ORDER — LIDOCAINE-EPINEPHRINE 1 %-1:100000 IJ SOLN
10.0000 mL | Freq: Once | INTRAMUSCULAR | Status: DC
  Filled 2017-04-24: qty 10

## 2017-04-24 NOTE — Discharge Instructions (Signed)
Today I placed 3 5-0 absorbable sutures inside your wound and a total of 7 6-0 nylon sutures on top.  The stitches need to come out in 5-7 days.
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
## 2019-03-26 ENCOUNTER — Emergency Department: Payer: Self-pay

## 2019-03-26 ENCOUNTER — Other Ambulatory Visit: Payer: Self-pay

## 2019-03-26 ENCOUNTER — Emergency Department
Admission: EM | Admit: 2019-03-26 | Discharge: 2019-03-26 | Disposition: A | Discharge status: Home / Self Care | Payer: Self-pay | Attending: Emergency Medicine | Admitting: Emergency Medicine

## 2019-03-26 ENCOUNTER — Encounter: Payer: Self-pay | Admitting: Emergency Medicine

## 2019-03-26 DIAGNOSIS — Z79899 Other long term (current) drug therapy: Secondary | ICD-10-CM | POA: Insufficient documentation

## 2019-03-26 DIAGNOSIS — Z87891 Personal history of nicotine dependence: Secondary | ICD-10-CM | POA: Insufficient documentation

## 2019-03-26 DIAGNOSIS — J45909 Unspecified asthma, uncomplicated: Secondary | ICD-10-CM | POA: Insufficient documentation

## 2019-03-26 DIAGNOSIS — N200 Calculus of kidney: Secondary | ICD-10-CM | POA: Insufficient documentation

## 2019-03-26 LAB — BASIC METABOLIC PANEL
Anion gap: 12 (ref 5–15)
BUN: 19 mg/dL (ref 6–20)
CO2: 21 mmol/L — ABNORMAL LOW (ref 22–32)
Calcium: 9.5 mg/dL (ref 8.9–10.3)
Chloride: 107 mmol/L (ref 98–111)
Creatinine, Ser: 1.06 mg/dL (ref 0.61–1.24)
GFR calc Af Amer: >60 mL/min (ref >60)
GFR calc non Af Amer: >60 mL/min (ref >60)
Glucose, Bld: 118 mg/dL — ABNORMAL HIGH (ref 70–99)
Potassium: 3.9 mmol/L (ref 3.5–5.1)
Sodium: 140 mmol/L (ref 135–145)

## 2019-03-26 LAB — CBC
HCT: 46 % (ref 39.0–52.0)
Hemoglobin: 15.5 g/dL (ref 13.0–17.0)
MCH: 30.7 pg (ref 26.0–34.0)
MCHC: 33.7 g/dL (ref 30.0–36.0)
MCV: 91.1 fL (ref 80.0–100.0)
Platelets: 205 10*3/uL (ref 150–400)
RBC: 5.05 MIL/uL (ref 4.22–5.81)
RDW: 12.7 % (ref 11.5–15.5)
WBC: 10 10*3/uL (ref 4.0–10.5)
nRBC: 0 % (ref 0.0–0.2)

## 2019-03-26 LAB — URINALYSIS, COMPLETE (UACMP) WITH MICROSCOPIC
Bacteria, UA: NONE SEEN
Bilirubin Urine: NEGATIVE
Glucose, UA: NEGATIVE mg/dL
Ketones, ur: NEGATIVE mg/dL
Nitrite: NEGATIVE
Protein, ur: 30 mg/dL — AB
Specific Gravity, Urine: 1.017 (ref 1.005–1.030)
Squamous Epithelial / LPF: NONE SEEN (ref 0–5)
pH: 5 (ref 5.0–8.0)

## 2019-03-26 MED ORDER — TAMSULOSIN HCL 0.4 MG PO CAPS: 0.4000 mg | ORAL_CAPSULE | Freq: Every day | ORAL | 0 refills | Status: AC

## 2019-03-26 MED ORDER — KETOROLAC TROMETHAMINE 30 MG/ML IJ SOLN
30.0000 mg | Freq: Once | INTRAMUSCULAR | Status: AC
  Administered 2019-03-26: 14:00:00 30 mg via INTRAMUSCULAR
  Filled 2019-03-26: qty 1

## 2019-03-26 MED ORDER — NAPROXEN 500 MG PO TABS: 500.0000 mg | ORAL_TABLET | Freq: Two times a day (BID) | ORAL | 2 refills | Status: AC

## 2019-03-26 NOTE — ED Triage Notes (Signed)
Patient presents to the ED with intermittent left flank pain x 1 month that today has become unbearable and constant.  Patient states today he has felt like he needed to urinate but has been unable to do so.  Patient reports history of kidney stones and states he feels similar.  Patient appears very uncomfortable during triage.

## 2019-03-26 NOTE — ED Provider Notes (Signed)
[facility] Emergency Department Provider Note   ____________________________________________    I have reviewed the triage vital signs and the nursing notes.   HISTORY  Chief Complaint Flank Pain     HPI [place] Aikeem Lilley is a 29 y.o. male who presents with complaints of severe left flank and left groin pain which started this morning.  He reports he took a very hot shower which helped somewhat.  He is not take any medications for this.  He does note a past history of opioid abuse, he does not want any narcotics.  No fevers or chills.  He reports it is somewhat difficult to urinate but no dysuria.  Pain does not radiate.  Mild nausea no vomiting  Past Medical History:  Diagnosis Date  . Asthma   . Depression   . Drug use     Patient Active Problem List   Diagnosis Date Noted  . Severe recurrent major depression (HCC) 08/09/2016  . PTSD (post-traumatic stress disorder) 08/09/2016  . Cannabis use disorder, severe, dependence (HCC) 08/09/2016    Past Surgical History:  Procedure Laterality Date  . KNEE SURGERY      Prior to Admission medications   Medication Sig Start Date End Date Taking? Authorizing Provider  FLUoxetine (PROZAC) 10 MG capsule Take 1 capsule (10 mg total) by mouth daily. 08/11/16   Jimmy Footman, MD  naproxen (NAPROSYN) 500 MG tablet Take 1 tablet (500 mg total) by mouth 2 (two) times daily with a meal. 03/26/19   [name], MD  tamsulosin (FLOMAX) 0.4 MG CAPS capsule Take 1 capsule (0.4 mg total) by mouth daily. 03/26/19   [name], MD  traZODone (DESYREL) 50 MG tablet Take 1 tablet (50 mg total) by mouth at bedtime as needed for sleep. 08/10/16   Jimmy Footman, MD     Allergies Patient has no known allergies.  No family history on file.  Social History Social History   Tobacco Use  . Smoking status: Former Games developer  . Smokeless tobacco: Never Used  Substance Use Topics  . Alcohol use:  Not Currently    Comment: occ  . Drug use: Not Currently    Frequency: 1.0 times per week    Comment: Heroin     Review of Systems  Constitutional: No fever/chills Eyes: No visual changes.  ENT: No sore throat. Cardiovascular: Denies chest pain. Respiratory: Denies shortness of breath. Gastrointestinal: As above Genitourinary: As above Musculoskeletal: Negative for back pain. Skin: Negative for rash. Neurological: Negative for headaches or weakness   ____________________________________________   PHYSICAL EXAM:  VITAL SIGNS: ED Triage Vitals  Enc Vitals Group     BP 03/26/19 1209 (!) 134/111     Pulse Rate 03/26/19 1209 76     Resp 03/26/19 1209 18     Temp 03/26/19 1209 97.7 F (36.5 C)     Temp Source 03/26/19 1209 Oral     SpO2 03/26/19 1209 98 %     Weight 03/26/19 1210 72.6 kg (160 lb)     Height 03/26/19 1210 1.829 m (6')     Head Circumference --      Peak Flow --      Pain Score 03/26/19 1209 8     Pain Loc --      Pain Edu? --      Excl. in GC? --     Constitutional: Alert and oriented.  Eyes: Conjunctivae are normal.  Head: Atraumatic.  Cardiovascular: Normal rate, regular rhythm.  Grossly normal heart sounds.  Good peripheral circulation. Respiratory: Normal respiratory effort.  No retractions. Lungs CTAB. Gastrointestinal: Soft and nontender. No distention.  No CVA tenderness.  Reassuring exam Genitourinary: deferred Musculoskeletal: No lower extremity tenderness nor edema.  Warm and well perfused Neurologic:  Normal speech and language. No gross focal neurologic deficits are appreciated.  Skin:  Skin is warm, dry and intact. No rash noted. Psychiatric: Mood and affect are normal. Speech and behavior are normal.  ____________________________________________   LABS (all labs ordered are listed, but only abnormal results are displayed)  Labs Reviewed  URINALYSIS, COMPLETE (UACMP) WITH MICROSCOPIC - Abnormal; Notable for the following  components:      Result Value   Color, Urine YELLOW (*)    APPearance CLEAR (*)    Hgb urine dipstick SMALL (*)    Protein, ur 30 (*)    Leukocytes,Ua TRACE (*)    All other components within normal limits  BASIC METABOLIC PANEL - Abnormal; Notable for the following components:   CO2 21 (*)    Glucose, Bld 118 (*)    All other components within normal limits  CBC   ____________________________________________  EKG   ____________________________________________  RADIOLOGY  CT scan demonstrates mild left hydronephrosis and hydroureter, 4 x 2 mm stone,  ____________________________________________   PROCEDURES  Procedure(s) performed: No  Procedures   Critical Care performed: No ____________________________________________   INITIAL IMPRESSION / ASSESSMENT AND PLAN / ED COURSE  Pertinent labs & imaging results that were available during my care of the patient were reviewed by me and considered in my medical decision making (see chart for details).  Patient treated with IM Toradol with significant improvement in pain.  CT confirms kidney stone, urinalysis is reassuring, no evidence of infection.  Will treat with NSAIDs, outpatient follow-up with urology as needed.  Return precautions discussed    ____________________________________________   FINAL CLINICAL IMPRESSION(S) / ED DIAGNOSES  Final diagnoses:  Kidney stone        Note:  This document was prepared using Dragon voice recognition software and may include unintentional dictation errors.   Lavonia Drafts, MD 03/26/19 [phone]

## 2020-01-17 ENCOUNTER — Other Ambulatory Visit: Payer: Self-pay

## 2020-01-17 ENCOUNTER — Emergency Department

## 2020-01-17 ENCOUNTER — Emergency Department
Admission: EM | Admit: 2020-01-17 | Discharge: 2020-01-17 | Disposition: A | Discharge status: Home / Self Care | Attending: Emergency Medicine | Admitting: Emergency Medicine

## 2020-01-17 DIAGNOSIS — S51011A Laceration without foreign body of right elbow, initial encounter: Secondary | ICD-10-CM | POA: Insufficient documentation

## 2020-01-17 DIAGNOSIS — J45909 Unspecified asthma, uncomplicated: Secondary | ICD-10-CM | POA: Insufficient documentation

## 2020-01-17 DIAGNOSIS — X58XXXA Exposure to other specified factors, initial encounter: Secondary | ICD-10-CM | POA: Insufficient documentation

## 2020-01-17 DIAGNOSIS — R464 Slowness and poor responsiveness: Secondary | ICD-10-CM | POA: Insufficient documentation

## 2020-01-17 DIAGNOSIS — Z20822 Contact with and (suspected) exposure to covid-19: Secondary | ICD-10-CM | POA: Insufficient documentation

## 2020-01-17 DIAGNOSIS — T40601A Poisoning by unspecified narcotics, accidental (unintentional), initial encounter: Secondary | ICD-10-CM

## 2020-01-17 DIAGNOSIS — T40604A Poisoning by unspecified narcotics, undetermined, initial encounter: Secondary | ICD-10-CM | POA: Insufficient documentation

## 2020-01-17 DIAGNOSIS — Z87891 Personal history of nicotine dependence: Secondary | ICD-10-CM | POA: Insufficient documentation

## 2020-01-17 DIAGNOSIS — R4182 Altered mental status, unspecified: Secondary | ICD-10-CM | POA: Insufficient documentation

## 2020-01-17 LAB — CBC WITH DIFFERENTIAL/PLATELET
Abs Immature Granulocytes: 0.04 10*3/uL (ref 0.00–0.07)
Basophils Absolute: 0.1 10*3/uL (ref 0.0–0.1)
Basophils Relative: 1 %
Eosinophils Absolute: 0.8 10*3/uL — ABNORMAL HIGH (ref 0.0–0.5)
Eosinophils Relative: 7 %
HCT: 43.4 % (ref 39.0–52.0)
Hemoglobin: 14.8 g/dL (ref 13.0–17.0)
Immature Granulocytes: 0 %
Lymphocytes Relative: 21 %
Lymphs Abs: 2.6 10*3/uL (ref 0.7–4.0)
MCH: 31 pg (ref 26.0–34.0)
MCHC: 34.1 g/dL (ref 30.0–36.0)
MCV: 91 fL (ref 80.0–100.0)
Monocytes Absolute: 1.1 10*3/uL — ABNORMAL HIGH (ref 0.1–1.0)
Monocytes Relative: 9 %
Neutro Abs: 7.7 10*3/uL (ref 1.7–7.7)
Neutrophils Relative %: 62 %
Platelets: 196 10*3/uL (ref 150–400)
RBC: 4.77 MIL/uL (ref 4.22–5.81)
RDW: 12.2 % (ref 11.5–15.5)
WBC: 12.2 10*3/uL — ABNORMAL HIGH (ref 4.0–10.5)
nRBC: 0 % (ref 0.0–0.2)

## 2020-01-17 LAB — COMPREHENSIVE METABOLIC PANEL
ALT: 16 U/L (ref 0–44)
AST: 20 U/L (ref 15–41)
Albumin: 4.2 g/dL (ref 3.5–5.0)
Alkaline Phosphatase: 70 U/L (ref 38–126)
Anion gap: 7 (ref 5–15)
BUN: 18 mg/dL (ref 6–20)
CO2: 28 mmol/L (ref 22–32)
Calcium: 9.2 mg/dL (ref 8.9–10.3)
Chloride: 103 mmol/L (ref 98–111)
Creatinine, Ser: 0.62 mg/dL (ref 0.61–1.24)
GFR, Estimated: >60 mL/min (ref >60)
Glucose, Bld: 109 mg/dL — ABNORMAL HIGH (ref 70–99)
Potassium: 3.9 mmol/L (ref 3.5–5.1)
Sodium: 138 mmol/L (ref 135–145)
Total Bilirubin: 0.9 mg/dL (ref 0.3–1.2)
Total Protein: 7.5 g/dL (ref 6.5–8.1)

## 2020-01-17 LAB — URINE DRUG SCREEN, QUALITATIVE (ARMC ONLY)
Amphetamines, Ur Screen: POSITIVE — AB
Barbiturates, Ur Screen: NOT DETECTED
Benzodiazepine, Ur Scrn: NOT DETECTED
Cannabinoid 50 Ng, Ur ~~LOC~~: POSITIVE — AB
Cocaine Metabolite,Ur ~~LOC~~: NOT DETECTED
MDMA (Ecstasy)Ur Screen: NOT DETECTED
Methadone Scn, Ur: NOT DETECTED
Opiate, Ur Screen: POSITIVE — AB
Phencyclidine (PCP) Ur S: NOT DETECTED
Tricyclic, Ur Screen: NOT DETECTED

## 2020-01-17 LAB — RESP PANEL BY RT-PCR (FLU A&B, COVID) ARPGX2
Influenza A by PCR: NEGATIVE
Influenza B by PCR: NEGATIVE
SARS Coronavirus 2 by RT PCR: NEGATIVE

## 2020-01-17 LAB — ETHANOL: Alcohol, Ethyl (B): <10 mg/dL (ref <10)

## 2020-01-17 LAB — SALICYLATE LEVEL: Salicylate Lvl: <7 mg/dL — ABNORMAL LOW (ref 7.0–30.0)

## 2020-01-17 LAB — ACETAMINOPHEN LEVEL: Acetaminophen (Tylenol), Serum: <10 ug/mL — ABNORMAL LOW (ref 10–30)

## 2020-01-17 NOTE — ED Notes (Signed)
Patient transported to CT 
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
## 2020-01-17 NOTE — ED Notes (Signed)
Sign pad not working. AVS reviewed with patient. Pt verbalized understanding and had no questions.
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
## 2021-05-31 IMAGING — DX DG CHEST 1V
1 series · 1 of 1 positions shown · non-contrast
Comparison: 03/19/2015

CLINICAL DATA: Altered mental status

EXAM:
CHEST  1 VIEW

[chest ap]
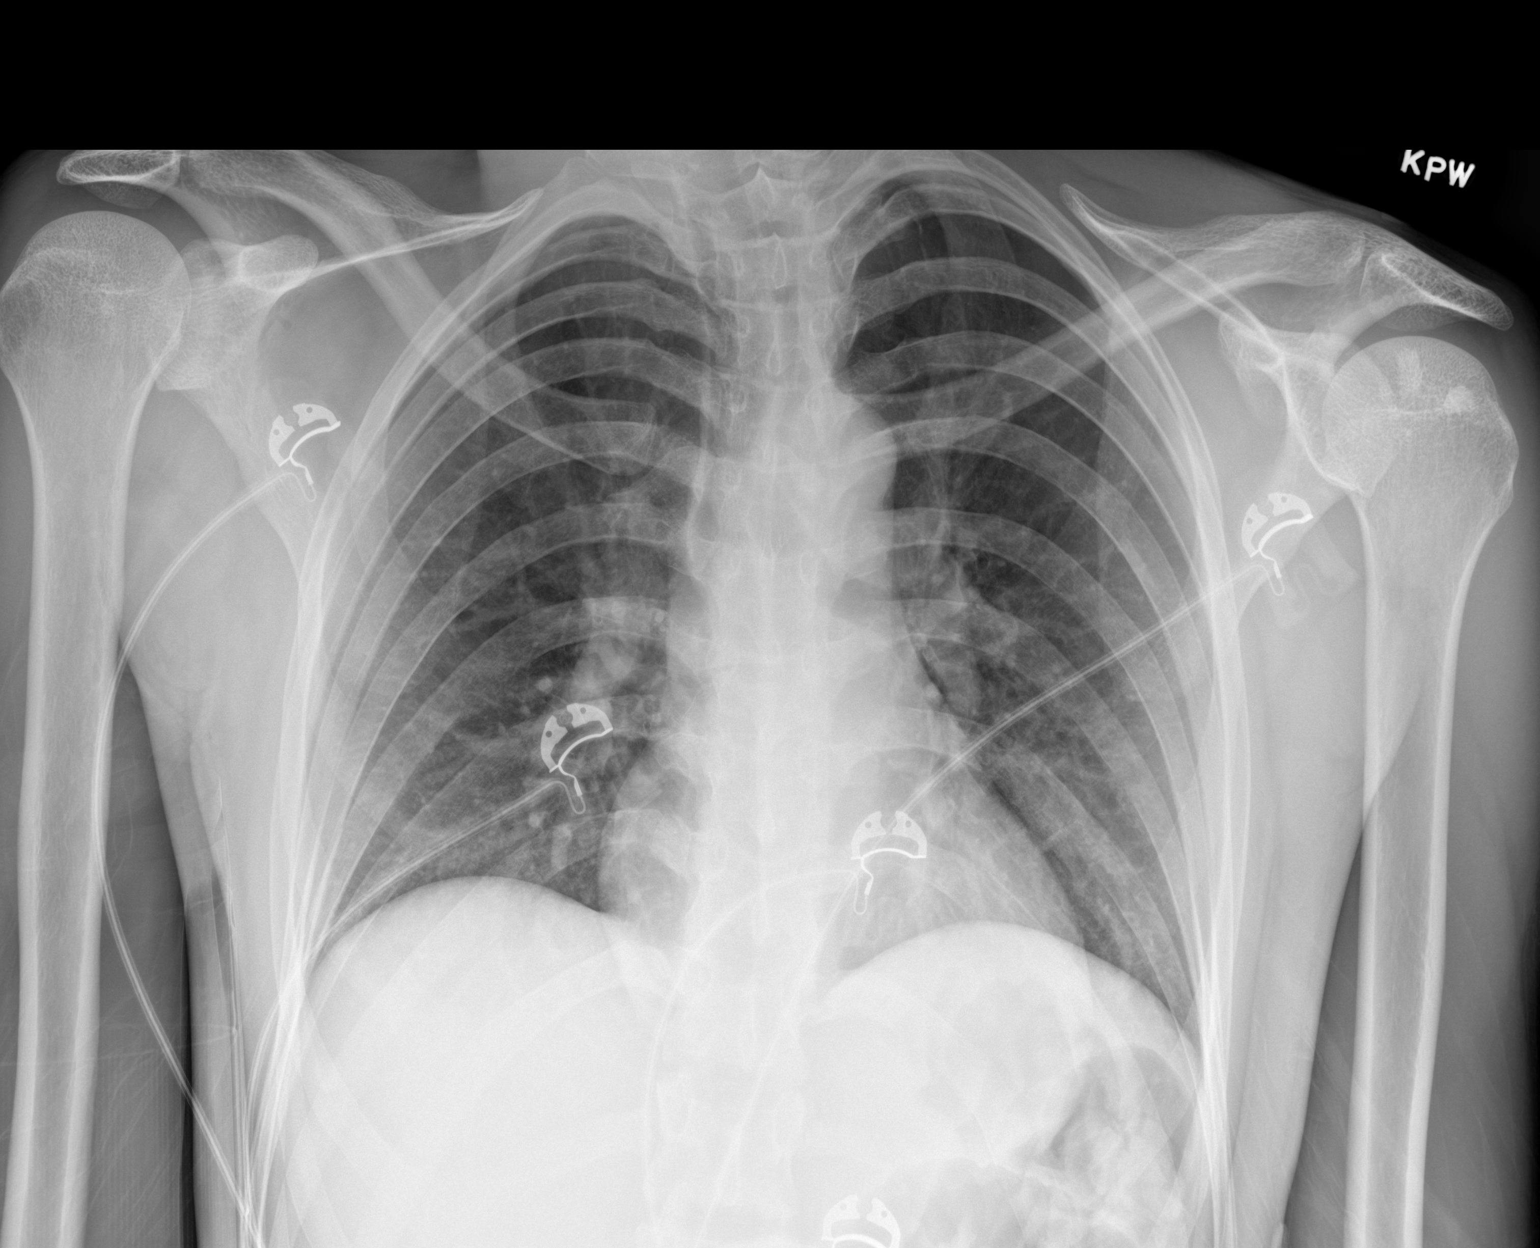

[1 of 1 positions shown; findings below may reference images not displayed]

FINDINGS: Normal heart size and mediastinal contours. No acute infiltrate or
edema. No effusion or pneumothorax. No acute osseous findings.
IMPRESSION: No evidence of active disease.
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
## 2021-12-28 ENCOUNTER — Emergency Department (HOSPITAL_COMMUNITY): Payer: Self-pay

## 2021-12-28 ENCOUNTER — Encounter (HOSPITAL_COMMUNITY): Payer: Self-pay | Admitting: General Surgery

## 2021-12-28 ENCOUNTER — Inpatient Hospital Stay (HOSPITAL_COMMUNITY)
Admission: EM | Admit: 2021-12-28 | Discharge: 2021-12-30 | DRG: 157 | Disposition: A | Discharge status: Home / Self Care | Payer: Self-pay | Attending: General Surgery | Admitting: General Surgery

## 2021-12-28 ENCOUNTER — Inpatient Hospital Stay (HOSPITAL_COMMUNITY): Payer: Self-pay

## 2021-12-28 ENCOUNTER — Other Ambulatory Visit: Payer: Self-pay

## 2021-12-28 DIAGNOSIS — S0292XA Unspecified fracture of facial bones, initial encounter for closed fracture: Secondary | ICD-10-CM | POA: Diagnosis present

## 2021-12-28 DIAGNOSIS — Z23 Encounter for immunization: Secondary | ICD-10-CM | POA: Diagnosis not present

## 2021-12-28 DIAGNOSIS — R4182 Altered mental status, unspecified: Secondary | ICD-10-CM | POA: Diagnosis present

## 2021-12-28 DIAGNOSIS — S0240CA Maxillary fracture, right side, initial encounter for closed fracture: Principal | ICD-10-CM | POA: Diagnosis present

## 2021-12-28 DIAGNOSIS — J690 Pneumonitis due to inhalation of food and vomit: Secondary | ICD-10-CM | POA: Diagnosis present

## 2021-12-28 DIAGNOSIS — S0240EA Zygomatic fracture, right side, initial encounter for closed fracture: Secondary | ICD-10-CM | POA: Diagnosis present

## 2021-12-28 DIAGNOSIS — J9601 Acute respiratory failure with hypoxia: Secondary | ICD-10-CM | POA: Diagnosis present

## 2021-12-28 DIAGNOSIS — Y92410 Unspecified street and highway as the place of occurrence of the external cause: Secondary | ICD-10-CM

## 2021-12-28 DIAGNOSIS — Z9911 Dependence on respirator [ventilator] status: Secondary | ICD-10-CM | POA: Diagnosis not present

## 2021-12-28 HISTORY — DX: Traumatic subdural hemorrhage with loss of consciousness status unknown, initial encounter: S06.5XAA

## 2021-12-28 HISTORY — DX: Other psychoactive substance abuse, uncomplicated: F19.10

## 2021-12-28 LAB — CBC
HCT: 46.8 % (ref 39.0–52.0)
Hemoglobin: 15.1 g/dL (ref 13.0–17.0)
MCH: 31.3 pg (ref 26.0–34.0)
MCHC: 32.3 g/dL (ref 30.0–36.0)
MCV: 97.1 fL (ref 80.0–100.0)
Platelets: 230 10*3/uL (ref 150–400)
RBC: 4.82 MIL/uL (ref 4.22–5.81)
RDW: 13 % (ref 11.5–15.5)
WBC: 23.8 10*3/uL — ABNORMAL HIGH (ref 4.0–10.5)
nRBC: 0 % (ref 0.0–0.2)

## 2021-12-28 LAB — I-STAT ARTERIAL BLOOD GAS, ED
Acid-base deficit: 5 mmol/L — ABNORMAL HIGH (ref 0.0–2.0)
Bicarbonate: 22.8 mmol/L (ref 20.0–28.0)
Calcium, Ion: 1.2 mmol/L (ref 1.15–1.40)
HCT: 47 % (ref 39.0–52.0)
Hemoglobin: 16 g/dL (ref 13.0–17.0)
O2 Saturation: 98 %
Potassium: 3.4 mmol/L — ABNORMAL LOW (ref 3.5–5.1)
Sodium: 137 mmol/L (ref 135–145)
TCO2: 24 mmol/L (ref 22–32)
pCO2 arterial: 49.8 mmHg — ABNORMAL HIGH (ref 32–48)
pH, Arterial: 7.27 — ABNORMAL LOW (ref 7.35–7.45)
pO2, Arterial: 124 mmHg — ABNORMAL HIGH (ref 83–108)

## 2021-12-28 LAB — COMPREHENSIVE METABOLIC PANEL
ALT: 19 U/L (ref 0–44)
AST: 37 U/L (ref 15–41)
Albumin: 4 g/dL (ref 3.5–5.0)
Alkaline Phosphatase: 112 U/L (ref 38–126)
Anion gap: 11 (ref 5–15)
BUN: 22 mg/dL — ABNORMAL HIGH (ref 6–20)
CO2: 17 mmol/L — ABNORMAL LOW (ref 22–32)
Calcium: 7.9 mg/dL — ABNORMAL LOW (ref 8.9–10.3)
Chloride: 108 mmol/L (ref 98–111)
Creatinine, Ser: 1.29 mg/dL — ABNORMAL HIGH (ref 0.61–1.24)
GFR, Estimated: >60 mL/min (ref >60)
Glucose, Bld: 209 mg/dL — ABNORMAL HIGH (ref 70–99)
Potassium: 3 mmol/L — ABNORMAL LOW (ref 3.5–5.1)
Sodium: 136 mmol/L (ref 135–145)
Total Bilirubin: 0.7 mg/dL (ref 0.3–1.2)
Total Protein: 7.1 g/dL (ref 6.5–8.1)

## 2021-12-28 LAB — URINALYSIS, ROUTINE W REFLEX MICROSCOPIC
Bilirubin Urine: NEGATIVE
Glucose, UA: 50 mg/dL — AB
Ketones, ur: NEGATIVE mg/dL
Leukocytes,Ua: NEGATIVE
Nitrite: NEGATIVE
Protein, ur: 100 mg/dL — AB
Specific Gravity, Urine: 1.041 — ABNORMAL HIGH (ref 1.005–1.030)
pH: 5 (ref 5.0–8.0)

## 2021-12-28 LAB — SAMPLE TO BLOOD BANK

## 2021-12-28 LAB — I-STAT CHEM 8, ED
BUN: 31 mg/dL — ABNORMAL HIGH (ref 6–20)
Calcium, Ion: 0.94 mmol/L — ABNORMAL LOW (ref 1.15–1.40)
Chloride: 105 mmol/L (ref 98–111)
Creatinine, Ser: 1 mg/dL (ref 0.61–1.24)
Glucose, Bld: 226 mg/dL — ABNORMAL HIGH (ref 70–99)
HCT: 48 % (ref 39.0–52.0)
Hemoglobin: 16.3 g/dL (ref 13.0–17.0)
Potassium: 3.3 mmol/L — ABNORMAL LOW (ref 3.5–5.1)
Sodium: 135 mmol/L (ref 135–145)
TCO2: 20 mmol/L — ABNORMAL LOW (ref 22–32)

## 2021-12-28 LAB — LACTIC ACID, PLASMA: Lactic Acid, Venous: 4.5 mmol/L (ref 0.5–1.9)

## 2021-12-28 LAB — PROTIME-INR
INR: 1.2 (ref 0.8–1.2)
Prothrombin Time: 14.9 seconds (ref 11.4–15.2)

## 2021-12-28 LAB — ETHANOL: Alcohol, Ethyl (B): <10 mg/dL (ref <10)

## 2021-12-28 LAB — HIV ANTIBODY (ROUTINE TESTING W REFLEX): HIV Screen 4th Generation wRfx: NONREACTIVE

## 2021-12-28 MED ORDER — PROPOFOL 1000 MG/100ML IV EMUL
0.0000 ug/kg/min | INTRAVENOUS | Status: DC
  Administered 2021-12-28 – 2021-12-29 (×4): 40 ug/kg/min via INTRAVENOUS
  Administered 2021-12-29: 35 ug/kg/min via INTRAVENOUS
  Filled 2021-12-28 (×4): qty 100

## 2021-12-28 MED ORDER — ETOMIDATE 2 MG/ML IV SOLN
INTRAVENOUS | Status: AC | PRN
  Administered 2021-12-28: 20 mg via INTRAVENOUS

## 2021-12-28 MED ORDER — PROPOFOL 1000 MG/100ML IV EMUL: 0.0000 ug/kg/min | INTRAVENOUS | Status: DC

## 2021-12-28 MED ORDER — ROCURONIUM BROMIDE 50 MG/5ML IV SOLN
INTRAVENOUS | Status: AC | PRN
  Administered 2021-12-28: 100 mg via INTRAVENOUS

## 2021-12-28 MED ORDER — SODIUM CHLORIDE 0.9 % IV SOLN
2.0000 g | Freq: Three times a day (TID) | INTRAVENOUS | Status: DC
  Administered 2021-12-28 – 2021-12-30 (×6): 2 g via INTRAVENOUS
  Filled 2021-12-28 (×6): qty 12.5

## 2021-12-28 MED ORDER — DEXMEDETOMIDINE HCL IN NACL 400 MCG/100ML IV SOLN
0.4000 ug/kg/h | INTRAVENOUS | Status: DC
  Filled 2021-12-28: qty 100

## 2021-12-28 MED ORDER — ORAL CARE MOUTH RINSE
15.0000 mL | OROMUCOSAL | Status: DC
  Administered 2021-12-29 (×10): 15 mL via OROMUCOSAL

## 2021-12-28 MED ORDER — FENTANYL CITRATE PF 50 MCG/ML IJ SOSY: 50.0000 ug | PREFILLED_SYRINGE | INTRAMUSCULAR | Status: DC | PRN

## 2021-12-28 MED ORDER — ENOXAPARIN SODIUM 30 MG/0.3ML IJ SOSY
30.0000 mg | PREFILLED_SYRINGE | Freq: Two times a day (BID) | INTRAMUSCULAR | Status: DC
  Administered 2021-12-29 – 2021-12-30 (×3): 30 mg via SUBCUTANEOUS
  Filled 2021-12-28 (×3): qty 0.3

## 2021-12-28 MED ORDER — TETANUS-DIPHTH-ACELL PERTUSSIS 5-2.5-18.5 LF-MCG/0.5 IM SUSY
0.5000 mL | PREFILLED_SYRINGE | Freq: Once | INTRAMUSCULAR | Status: AC
  Administered 2021-12-28: 0.5 mL via INTRAMUSCULAR

## 2021-12-28 MED ORDER — SODIUM CHLORIDE 0.9 % IV SOLN: INTRAVENOUS | Status: DC

## 2021-12-28 MED ORDER — SODIUM CHLORIDE 0.9 % IV SOLN
INTRAVENOUS | Status: AC | PRN
  Administered 2021-12-28: 1000 mL via INTRAVENOUS

## 2021-12-28 MED ORDER — POLYETHYLENE GLYCOL 3350 17 G PO PACK: 17.0000 g | PACK | Freq: Every day | ORAL | Status: DC

## 2021-12-28 MED ORDER — PROPOFOL 1000 MG/100ML IV EMUL
INTRAVENOUS | Status: AC | PRN
  Administered 2021-12-28: 40 ug/kg/min via INTRAVENOUS

## 2021-12-28 MED ORDER — ORAL CARE MOUTH RINSE: 15.0000 mL | OROMUCOSAL | Status: DC | PRN

## 2021-12-28 MED ORDER — FENTANYL BOLUS VIA INFUSION: 50.0000 ug | INTRAVENOUS | Status: DC | PRN

## 2021-12-28 MED ORDER — ACETAMINOPHEN 160 MG/5ML PO SOLN: 650.0000 mg | Freq: Four times a day (QID) | ORAL | Status: DC | PRN

## 2021-12-28 MED ORDER — FENTANYL 2500MCG IN NS 250ML (10MCG/ML) PREMIX INFUSION
50.0000 ug/h | INTRAVENOUS | Status: DC
  Administered 2021-12-28: 100 ug/h via INTRAVENOUS
  Administered 2021-12-28: 50 ug/h via INTRAVENOUS
  Administered 2021-12-29: 200 ug/h via INTRAVENOUS
  Filled 2021-12-28 (×2): qty 250

## 2021-12-28 MED ORDER — POTASSIUM CHLORIDE 10 MEQ/100ML IV SOLN
10.0000 meq | INTRAVENOUS | Status: AC
  Administered 2021-12-28 (×6): 10 meq via INTRAVENOUS
  Filled 2021-12-28 (×6): qty 100

## 2021-12-28 MED ORDER — MIDAZOLAM HCL 2 MG/2ML IJ SOLN
2.0000 mg | INTRAMUSCULAR | Status: DC | PRN
  Administered 2021-12-28 – 2021-12-29 (×3): 2 mg via INTRAVENOUS
  Filled 2021-12-28 (×3): qty 2

## 2021-12-28 MED ORDER — DOCUSATE SODIUM 50 MG/5ML PO LIQD
100.0000 mg | Freq: Two times a day (BID) | ORAL | Status: DC
  Administered 2021-12-28: 100 mg
  Filled 2021-12-28: qty 10

## 2021-12-28 MED ORDER — IOHEXOL 350 MG/ML SOLN
80.0000 mL | Freq: Once | INTRAVENOUS | Status: AC | PRN
  Administered 2021-12-28: 80 mL via INTRAVENOUS

## 2021-12-28 MED ORDER — FENTANYL CITRATE PF 50 MCG/ML IJ SOSY: 50.0000 ug | PREFILLED_SYRINGE | Freq: Once | INTRAMUSCULAR | Status: DC
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
## 2021-12-28 NOTE — ED Notes (Signed)
Intubated, 26 @ the lip.

## 2021-12-28 NOTE — ED Notes (Signed)
X-ray at bedside
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
## 2021-12-28 NOTE — ED Notes (Signed)
RT at bedside drawing ABG.

## 2021-12-28 NOTE — ED Notes (Signed)
Pt began waking up, nonverbal, very confused, reaching, grabbing, swatting, kicking & sitting up trying to get off bed. All hands on deck to keep him safe & on the bed, no injuries seen to back when he sat up.
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
## 2021-12-28 NOTE — ED Notes (Addendum)
ET tube pulled to 25 @ the lip.
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
## 2021-12-28 NOTE — ED Notes (Signed)
Transported to CT with ALL required staff at bedside.

## 2021-12-28 NOTE — ED Notes (Signed)
Pt was noted to have unequal pupils at this time, Trauma provider informed.
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
## 2021-12-29 ENCOUNTER — Encounter (HOSPITAL_COMMUNITY): Payer: Self-pay | Admitting: General Surgery

## 2021-12-29 ENCOUNTER — Inpatient Hospital Stay (HOSPITAL_COMMUNITY): Payer: Self-pay

## 2021-12-29 LAB — RAPID URINE DRUG SCREEN, HOSP PERFORMED
Amphetamines: POSITIVE — AB
Barbiturates: NOT DETECTED
Benzodiazepines: NOT DETECTED
Cocaine: NOT DETECTED
Opiates: NOT DETECTED
Tetrahydrocannabinol: NOT DETECTED

## 2021-12-29 LAB — BASIC METABOLIC PANEL
Anion gap: 4 — ABNORMAL LOW (ref 5–15)
BUN: 22 mg/dL — ABNORMAL HIGH (ref 6–20)
CO2: 20 mmol/L — ABNORMAL LOW (ref 22–32)
Calcium: 8 mg/dL — ABNORMAL LOW (ref 8.9–10.3)
Chloride: 110 mmol/L (ref 98–111)
Creatinine, Ser: 1.1 mg/dL (ref 0.61–1.24)
GFR, Estimated: >60 mL/min (ref >60)
Glucose, Bld: 85 mg/dL (ref 70–99)
Potassium: 4.2 mmol/L (ref 3.5–5.1)
Sodium: 134 mmol/L — ABNORMAL LOW (ref 135–145)

## 2021-12-29 LAB — CBC
HCT: 41.1 % (ref 39.0–52.0)
Hemoglobin: 13.4 g/dL (ref 13.0–17.0)
MCH: 30.4 pg (ref 26.0–34.0)
MCHC: 32.6 g/dL (ref 30.0–36.0)
MCV: 93.2 fL (ref 80.0–100.0)
Platelets: 170 10*3/uL (ref 150–400)
RBC: 4.41 MIL/uL (ref 4.22–5.81)
RDW: 13.2 % (ref 11.5–15.5)
WBC: 19.7 10*3/uL — ABNORMAL HIGH (ref 4.0–10.5)
nRBC: 0 % (ref 0.0–0.2)

## 2021-12-29 LAB — TRIGLYCERIDES: Triglycerides: 57 mg/dL (ref <150)

## 2021-12-29 LAB — LACTIC ACID, PLASMA: Lactic Acid, Venous: 1.2 mmol/L (ref 0.5–1.9)

## 2021-12-29 MED ORDER — HALOPERIDOL LACTATE 5 MG/ML IJ SOLN: 10.0000 mg | Freq: Four times a day (QID) | INTRAMUSCULAR | Status: DC | PRN

## 2021-12-29 MED ORDER — OXYCODONE HCL 5 MG PO TABS
5.0000 mg | ORAL_TABLET | ORAL | Status: DC | PRN
  Administered 2021-12-29 – 2021-12-30 (×5): 10 mg
  Filled 2021-12-29 (×6): qty 2

## 2021-12-29 MED ORDER — MORPHINE SULFATE (PF) 2 MG/ML IV SOLN
2.0000 mg | INTRAVENOUS | Status: DC | PRN
  Administered 2021-12-29 (×3): 2 mg via INTRAVENOUS
  Administered 2021-12-30: 4 mg via INTRAVENOUS
  Filled 2021-12-29 (×3): qty 1
  Filled 2021-12-29: qty 2

## 2021-12-29 MED ORDER — METHOCARBAMOL 500 MG PO TABS
1000.0000 mg | ORAL_TABLET | Freq: Three times a day (TID) | ORAL | Status: DC
  Administered 2021-12-29 – 2021-12-30 (×4): 1000 mg via ORAL
  Filled 2021-12-29 (×4): qty 2

## 2021-12-29 MED ORDER — ACETAMINOPHEN 500 MG PO TABS
1000.0000 mg | ORAL_TABLET | Freq: Four times a day (QID) | ORAL | Status: DC
  Administered 2021-12-29 – 2021-12-30 (×5): 1000 mg via ORAL
  Filled 2021-12-29 (×5): qty 2

## 2021-12-29 MED ORDER — CHLORHEXIDINE GLUCONATE CLOTH 2 % EX PADS
6.0000 | MEDICATED_PAD | Freq: Every day | CUTANEOUS | Status: DC
  Administered 2021-12-29 – 2021-12-30 (×2): 6 via TOPICAL

## 2021-12-29 MED ORDER — POLYETHYLENE GLYCOL 3350 17 G PO PACK: 17.0000 g | PACK | Freq: Every day | ORAL | Status: DC

## 2021-12-29 MED ORDER — ACETAMINOPHEN 160 MG/5ML PO SOLN
1000.0000 mg | Freq: Four times a day (QID) | ORAL | Status: DC
  Filled 2021-12-29: qty 40.6

## 2021-12-29 MED ORDER — DOCUSATE SODIUM 100 MG PO CAPS
100.0000 mg | ORAL_CAPSULE | Freq: Two times a day (BID) | ORAL | Status: DC
  Administered 2021-12-29 – 2021-12-30 (×2): 100 mg via ORAL
  Filled 2021-12-29 (×2): qty 1

## 2021-12-29 MED ORDER — KETOROLAC TROMETHAMINE 15 MG/ML IJ SOLN
15.0000 mg | Freq: Four times a day (QID) | INTRAMUSCULAR | Status: AC
  Administered 2021-12-29 – 2021-12-30 (×4): 15 mg via INTRAVENOUS
  Filled 2021-12-29 (×4): qty 1

## 2021-12-29 MED ORDER — HALOPERIDOL LACTATE 5 MG/ML IJ SOLN
10.0000 mg | Freq: Four times a day (QID) | INTRAMUSCULAR | Status: DC | PRN
  Administered 2021-12-30: 10 mg via INTRAVENOUS
  Filled 2021-12-29: qty 2
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
## 2021-12-29 NOTE — Procedures (Signed)
Extubation Procedure Note  Patient Details:   Name: [name] DOB: 02/29/1990 MRN: [id number]   Airway Documentation:    Vent end date: 12/29/21 Vent end time: 0748   Evaluation  O2 sats: stable throughout Complications: No apparent complications Patient did tolerate procedure well. Bilateral Breath Sounds: Clear   Yes  Edward Romero T 12/29/2021, 7:52 AM
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
## 2021-12-29 NOTE — TOC CM/SW Note (Signed)
..   Transition of Care [facility]) Screening Note   Patient Details  Name: [name] Date of Birth: [birth year]   [facility]) CM/SW Contact:    Elliot Cousin, RN Phone Number: 12/29/2021, 4:01 PM    Transition of Care Department [facility]) has reviewed patient. If new patient transition needs arise, please place a TOC consult. Will continue to follow for dc needs. Isidoro Donning RN3 CCM, Heart Failure TOC CM [phone]

## 2021-12-29 NOTE — Progress Notes (Signed)
Pt placed in SBT and is currently alert in no distress.    12/29/21 0734  Adult Ventilator Settings  Vent Mode PSV;CPAP  FiO2 (%) 30 %  Pressure Support 5 cmH20  PEEP 5 cmH20  Adult Ventilator Measurements  Peak Airway Pressure 11 L/min  Mean Airway Pressure 8 cmH20  Resp Rate Spontaneous 15 br/min  Resp Rate Total 15 br/min  Spont TV 570 mL  Measured Ve 9.4 mL  SpO2 97 %

## 2021-12-30 ENCOUNTER — Other Ambulatory Visit (HOSPITAL_COMMUNITY): Payer: Self-pay

## 2021-12-30 LAB — BASIC METABOLIC PANEL
Anion gap: 9 (ref 5–15)
BUN: 12 mg/dL (ref 6–20)
CO2: 23 mmol/L (ref 22–32)
Calcium: 9 mg/dL (ref 8.9–10.3)
Chloride: 106 mmol/L (ref 98–111)
Creatinine, Ser: 0.93 mg/dL (ref 0.61–1.24)
GFR, Estimated: >60 mL/min (ref >60)
Glucose, Bld: 93 mg/dL (ref 70–99)
Potassium: 3.9 mmol/L (ref 3.5–5.1)
Sodium: 138 mmol/L (ref 135–145)

## 2021-12-30 LAB — URINE CULTURE: Culture: NO GROWTH

## 2021-12-30 LAB — CBC
HCT: 38.8 % — ABNORMAL LOW (ref 39.0–52.0)
Hemoglobin: 12.5 g/dL — ABNORMAL LOW (ref 13.0–17.0)
MCH: 30.3 pg (ref 26.0–34.0)
MCHC: 32.2 g/dL (ref 30.0–36.0)
MCV: 94.2 fL (ref 80.0–100.0)
Platelets: 171 10*3/uL (ref 150–400)
RBC: 4.12 MIL/uL — ABNORMAL LOW (ref 4.22–5.81)
RDW: 12.9 % (ref 11.5–15.5)
WBC: 15.4 10*3/uL — ABNORMAL HIGH (ref 4.0–10.5)
nRBC: 0 % (ref 0.0–0.2)

## 2021-12-30 MED ORDER — OXYCODONE HCL 5 MG PO TABS: 5.0000 mg | ORAL_TABLET | Freq: Four times a day (QID) | ORAL | 0 refills | Status: AC | PRN

## 2021-12-30 MED ORDER — OXYCODONE HCL 5 MG PO TABS: 5.0000 mg | ORAL_TABLET | ORAL | Status: DC | PRN

## 2021-12-30 MED ORDER — AMOXICILLIN-POT CLAVULANATE 875-125 MG PO TABS
1.0000 | ORAL_TABLET | Freq: Two times a day (BID) | ORAL | 0 refills | Status: AC
  Filled 2021-12-30: qty 10, 5d supply, fill #0

## 2021-12-30 MED ORDER — DOCUSATE SODIUM 100 MG PO CAPS
100.0000 mg | ORAL_CAPSULE | Freq: Two times a day (BID) | ORAL | 0 refills | Status: AC
  Filled 2021-12-30: qty 10, 5d supply, fill #0

## 2021-12-30 MED ORDER — OXYCODONE HCL 5 MG PO TABS
5.0000 mg | ORAL_TABLET | Freq: Four times a day (QID) | ORAL | 0 refills | Status: DC | PRN
  Filled 2021-12-30: qty 15, 4d supply, fill #0

## 2021-12-30 MED ORDER — ACETAMINOPHEN 500 MG PO TABS
1000.0000 mg | ORAL_TABLET | Freq: Four times a day (QID) | ORAL | 0 refills | Status: AC | PRN
  Filled 2021-12-30: qty 30, 4d supply, fill #0
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
## 2021-12-31 ENCOUNTER — Other Ambulatory Visit: Payer: Self-pay

## 2021-12-31 MED ORDER — OXYCODONE HCL 5 MG PO TABS
ORAL_TABLET | ORAL | 0 refills | Status: DC
Start: 2021-12-31 — End: 2021-12-31
  Filled 2021-12-31: qty 15, 5d supply, fill #0

## 2022-01-04 ENCOUNTER — Other Ambulatory Visit: Payer: Self-pay

## 2022-01-04 MED ORDER — OXYCODONE HCL 5 MG PO TABS
ORAL_TABLET | ORAL | 0 refills | Status: AC
  Filled 2022-01-04: qty 15, 3d supply, fill #0
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
## 2022-04-26 ENCOUNTER — Emergency Department
Admission: EM | Admit: 2022-04-26 | Discharge: 2022-04-26 | Disposition: A | Discharge status: Home / Self Care | Payer: Self-pay | Attending: Emergency Medicine | Admitting: Emergency Medicine

## 2022-04-26 ENCOUNTER — Other Ambulatory Visit: Payer: Self-pay

## 2022-04-26 DIAGNOSIS — R110 Nausea: Secondary | ICD-10-CM | POA: Insufficient documentation

## 2022-04-26 MED ORDER — ONDANSETRON 8 MG PO TBDP
8.0000 mg | ORAL_TABLET | Freq: Once | ORAL | Status: AC
Start: 2022-04-26 — End: 2022-04-26
  Administered 2022-04-26: 8 mg via ORAL
  Filled 2022-04-26: qty 1

## 2022-04-26 MED ORDER — ONDANSETRON 4 MG PO TBDP: 4.0000 mg | ORAL_TABLET | Freq: Three times a day (TID) | ORAL | 0 refills | Status: AC | PRN

## 2022-04-26 NOTE — ED Provider Notes (Signed)
[facility] Provider Note  Patient Contact: 8:12 PM (approximate)   History   Nausea   HPI  Edward Romero is a 32 y.o. male who presents the emergency department complaining of nausea and needing a work note.  Patient states that 3 days ago he accidentally overdosed on fentanyl, was given Narcan and since then he has had nausea.  He has no abdominal pain, chest pain, shortness of breath, fevers, chills, nasal congestion or sore throat.  Patient states that he was feeling nauseated today, tried to call at work and was told the only way to be out of work was to have for work note.  He is presenting with nausea but is mainly requesting a note from work at this time.     Physical Exam   Triage Vital Signs: ED Triage Vitals  Enc Vitals Group     BP 04/26/22 1751 121/81     Pulse Rate 04/26/22 1751 81     Resp 04/26/22 1751 18     Temp 04/26/22 1751 98 F (36.7 C)     Temp Source 04/26/22 1751 Oral     SpO2 04/26/22 1751 99 %     Weight --      Height --      Head Circumference --      Peak Flow --      Pain Score 04/26/22 1750 0     Pain Loc --      Pain Edu? --      Excl. in [place]? --     Most recent vital signs: Vitals:   04/26/22 1751  BP: 121/81  Pulse: 81  Resp: 18  Temp: 98 F (36.7 C)  SpO2: 99%     General: Alert and in no acute distress.   Cardiovascular:  Good peripheral perfusion Respiratory: Normal respiratory effort without tachypnea or retractions. Lungs CTAB.  Gastrointestinal: Bowel sounds 4 quadrants. Soft and nontender to palpation. No guarding or rigidity. No palpable masses. No distention. Musculoskeletal: Full range of motion to all extremities.  Neurologic:  No gross focal neurologic deficits are appreciated.  Skin:   No rash noted Other:   ED Results / Procedures / Treatments   Labs (all labs ordered are listed, but only abnormal results are displayed) Labs Reviewed - No data to  display   EKG     RADIOLOGY    No results found.  PROCEDURES:  Critical Care performed: No  Procedures   MEDICATIONS ORDERED IN ED: Medications - No data to display   IMPRESSION / MDM / [place] / ED COURSE  I reviewed the triage vital signs and the nursing notes.                                 Differential diagnosis includes, but is not limited to, overdose, withdrawal, drug use, viral illness, gastritis, colitis  Patient's presentation is most consistent with acute presentation with potential threat to life or bodily function.   Patient's diagnosis is consistent with nausea.  Patient presents emergency department with nausea after having a near fatal overdose on fentanyl.  Patient received Narcan and has been stable since.  Patient states that he has been nauseated since this occurrence 3 days ago.  No emesis and no diarrhea.  Patient denies any fevers or URI symptoms.  Denies any abdominal pain.  Patient declines any workup at this time stating that  he primarily is here for a work note as he was nauseated, did not feel like going to work and was told by his Freight forwarder that the only way he could be always have a work note.  Patient will have prescription for Zofran.  Concerning signs and symptoms and return precautions discussed with the patient.  Patient is AOx4 and able to make his own decisions.  He is refusing any kind of evaluation and given the patient's presentation, physical exam and vitals I feel this is reasonable not to pursue any aggressive workup at this time.  Again symptomatic control will be provided, return precautions discussed with the patient. Patient is given ED precautions to return to the ED for any worsening or new symptoms.     FINAL CLINICAL IMPRESSION(S) / ED DIAGNOSES   Final diagnoses:  Nausea     Rx / DC Orders   ED Discharge Orders     None        Note:  This document was prepared using Dragon voice recognition  software and may include unintentional dictation errors.   Brynda Peon 04/26/22 2016    Harvest Dark, MD 04/26/22 2224

## 2022-04-26 NOTE — ED Triage Notes (Addendum)
Pt to ED via POV from home. Pt reports nausea for a few days. Pt denies V/D. Pt reports has still been able to eat and drink like normal.   Pt then reports overdosed on fentanyl 3 days ago and thinks it may be related. Pt reports he used fentanyl yesterday. Pt denies etoh use. Pt then states he went to work the last 2 nights and needs a work note.

## 2022-05-19 ENCOUNTER — Ambulatory Visit
Admission: EM | Admit: 2022-05-19 | Discharge: 2022-05-19 | Disposition: A | Discharge status: Home / Self Care | Payer: Self-pay | Attending: Emergency Medicine | Admitting: Emergency Medicine

## 2022-05-19 DIAGNOSIS — J45909 Unspecified asthma, uncomplicated: Secondary | ICD-10-CM | POA: Insufficient documentation

## 2022-05-19 DIAGNOSIS — Z1152 Encounter for screening for COVID-19: Secondary | ICD-10-CM | POA: Insufficient documentation

## 2022-05-19 DIAGNOSIS — B349 Viral infection, unspecified: Secondary | ICD-10-CM | POA: Insufficient documentation

## 2022-05-19 DIAGNOSIS — F17210 Nicotine dependence, cigarettes, uncomplicated: Secondary | ICD-10-CM | POA: Insufficient documentation

## 2022-05-19 LAB — POCT RAPID STREP A (OFFICE): Rapid Strep A Screen: NEGATIVE

## 2022-05-19 NOTE — Discharge Instructions (Addendum)
Your COVID test is pending.    Take Tylenol as needed for fever or discomfort.  Rest and keep yourself hydrated.    Follow-up with your primary care provider if your symptoms are not improving.     

## 2022-05-19 NOTE — ED Notes (Signed)
Attempted to contact patient multiple times via phone number listed, due to patient leaving his ear bud upon discharge. Patient not visualized in the parking lot when trying to find him to return it.
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
## 2022-05-19 NOTE — ED Triage Notes (Signed)
Patient to Urgent Care with complaints of cough/ chills (denies any fevers)/ sore throat. Reports feeling generally unwell.   Symptoms started three days ago.

## 2022-05-20 LAB — SARS CORONAVIRUS 2 (TAT 6-24 HRS): SARS Coronavirus 2: NEGATIVE

## 2022-07-24 ENCOUNTER — Emergency Department (HOSPITAL_COMMUNITY)
Admission: EM | Admit: 2022-07-24 | Discharge: 2022-07-25 | Disposition: A | Discharge status: Home / Self Care | Payer: Self-pay | Attending: Emergency Medicine | Admitting: Emergency Medicine

## 2022-07-24 ENCOUNTER — Other Ambulatory Visit: Payer: Self-pay

## 2022-07-24 ENCOUNTER — Encounter (HOSPITAL_COMMUNITY): Payer: Self-pay

## 2022-07-24 DIAGNOSIS — R404 Transient alteration of awareness: Secondary | ICD-10-CM

## 2022-07-24 DIAGNOSIS — T50901A Poisoning by unspecified drugs, medicaments and biological substances, accidental (unintentional), initial encounter: Secondary | ICD-10-CM

## 2022-07-24 DIAGNOSIS — T50991A Poisoning by other drugs, medicaments and biological substances, accidental (unintentional), initial encounter: Secondary | ICD-10-CM | POA: Insufficient documentation

## 2022-07-24 DIAGNOSIS — R464 Slowness and poor responsiveness: Secondary | ICD-10-CM | POA: Insufficient documentation

## 2022-07-24 LAB — BASIC METABOLIC PANEL
Anion gap: 13 (ref 5–15)
BUN: 20 mg/dL (ref 6–20)
CO2: 22 mmol/L (ref 22–32)
Calcium: 8.9 mg/dL (ref 8.9–10.3)
Chloride: 105 mmol/L (ref 98–111)
Creatinine, Ser: 0.93 mg/dL (ref 0.61–1.24)
GFR, Estimated: >60 mL/min (ref >60)
Glucose, Bld: 98 mg/dL (ref 70–99)
Potassium: 4.3 mmol/L (ref 3.5–5.1)
Sodium: 140 mmol/L (ref 135–145)

## 2022-07-24 LAB — CBC WITH DIFFERENTIAL/PLATELET
Abs Immature Granulocytes: 0.02 10*3/uL (ref 0.00–0.07)
Basophils Absolute: 0.1 10*3/uL (ref 0.0–0.1)
Basophils Relative: 1 %
Eosinophils Absolute: 0.4 10*3/uL (ref 0.0–0.5)
Eosinophils Relative: 3 %
HCT: 40.9 % (ref 39.0–52.0)
Hemoglobin: 13.1 g/dL (ref 13.0–17.0)
Immature Granulocytes: 0 %
Lymphocytes Relative: 19 %
Lymphs Abs: 2.4 10*3/uL (ref 0.7–4.0)
MCH: 30 pg (ref 26.0–34.0)
MCHC: 32 g/dL (ref 30.0–36.0)
MCV: 93.8 fL (ref 80.0–100.0)
Monocytes Absolute: 1.2 10*3/uL — ABNORMAL HIGH (ref 0.1–1.0)
Monocytes Relative: 10 %
Neutro Abs: 8.5 10*3/uL — ABNORMAL HIGH (ref 1.7–7.7)
Neutrophils Relative %: 67 %
Platelets: 194 10*3/uL (ref 150–400)
RBC: 4.36 MIL/uL (ref 4.22–5.81)
RDW: 12.3 % (ref 11.5–15.5)
WBC: 12.5 10*3/uL — ABNORMAL HIGH (ref 4.0–10.5)
nRBC: 0 % (ref 0.0–0.2)

## 2022-07-24 LAB — CK: Total CK: 545 U/L — ABNORMAL HIGH (ref 49–397)

## 2022-07-24 LAB — CBG MONITORING, ED: Glucose-Capillary: 104 mg/dL — ABNORMAL HIGH (ref 70–99)

## 2022-07-24 MED ORDER — SODIUM CHLORIDE 0.9 % IV BOLUS
1000.0000 mL | Freq: Once | INTRAVENOUS | Status: AC
  Administered 2022-07-24: 1000 mL via INTRAVENOUS

## 2022-07-24 NOTE — ED Provider Notes (Signed)
[place] EMERGENCY DEPARTMENT AT [facility] Provider Note   CSN: [id number] Arrival date & time: 07/24/22  1630     History {Add pertinent medical, surgical, social history, OB history to HPI:1} Chief Complaint  Patient presents with   Drug Overdose    Edward Romero is a 32 y.o. male.  He is brought in by EMS after being found unresponsive.  Reportedly has been out in the heat.  Was picked up by [employer] given Narcan and some D10 for low blood sugar.  Mental status improved.  He did receive rescue breaths by fire department for 10 minutes.  He also reportedly had an unresponsive episode earlier today and received Narcan and refused transport.  He denies any medical complaints although he is very drowsy.  He denies any medical history or medication use.  He denies any ceased any drugs.  The history is provided by the patient and the EMS personnel.  Drug Overdose This is a recurrent problem. The current episode started less than 1 hour ago. The problem has been gradually improving. Nothing aggravates the symptoms. Nothing relieves the symptoms. Treatments tried: narcan, D10. The treatment provided moderate relief.       Home Medications Prior to Admission medications   Medication Sig Start Date End Date Taking? Authorizing Provider  acetaminophen (TYLENOL) 500 MG tablet Take 2 tablets (1,000 mg total) by mouth every 6 (six) hours as needed. Patient not taking: Reported on 05/19/2022 12/30/21   Adam Phenix, PA-C  docusate sodium (COLACE) 100 MG capsule Take 1 capsule (100 mg total) by mouth 2 (two) times daily. Patient not taking: Reported on 05/19/2022 12/30/21   Adam Phenix, PA-C  FLUoxetine (PROZAC) 10 MG capsule Take 1 capsule (10 mg total) by mouth daily. Patient not taking: Reported on 05/19/2022 08/11/16   Jimmy Footman, MD  FLUoxetine (PROZAC) 20 MG tablet Take 20 mg by mouth daily.    [provider]  mirtazapine (REMERON) 15  MG tablet Take 15 mg by mouth daily.    [provider]  naproxen (NAPROSYN) 500 MG tablet Take 1 tablet (500 mg total) by mouth 2 (two) times daily with a meal. Patient not taking: Reported on 05/19/2022 03/26/19   [name], MD  ondansetron (ZOFRAN-ODT) 4 MG disintegrating tablet Take 1 tablet (4 mg total) by mouth every 8 (eight) hours as needed. Patient not taking: Reported on 05/19/2022 04/26/22   Cuthriell, Delorise Royals, PA-C  oxyCODONE (OXY IR/ROXICODONE) 5 MG immediate release tablet Take 1 tablet (5 mg total) by mouth every 6 (six) hours as needed for Pain (for severe pain, not relieved by Tylenol) Patient not taking: Reported on 05/19/2022 01/04/22     tamsulosin (FLOMAX) 0.4 MG CAPS capsule Take 1 capsule (0.4 mg total) by mouth daily. Patient not taking: Reported on 05/19/2022 03/26/19   [name], MD  traZODone (DESYREL) 50 MG tablet Take 1 tablet (50 mg total) by mouth at bedtime as needed for sleep. Patient not taking: Reported on 05/19/2022 08/10/16   Jimmy Footman, MD      Allergies    Patient has no known allergies.    Review of Systems   Review of Systems  Unable to perform ROS: Mental status change    Physical Exam Updated Vital Signs There were no vitals taken for this visit. Physical Exam Vitals and nursing note reviewed.  Constitutional:      General: He is not in acute distress.    Appearance: Normal appearance. He  is well-developed.  HENT:     Head: Normocephalic and atraumatic.  Eyes:     Conjunctiva/sclera: Conjunctivae normal.  Cardiovascular:     Rate and Rhythm: Normal rate and regular rhythm.     Heart sounds: No murmur heard. Pulmonary:     Effort: Pulmonary effort is normal. No respiratory distress.     Breath sounds: Normal breath sounds.  Abdominal:     Palpations: Abdomen is soft.     Tenderness: There is no abdominal tenderness.  Musculoskeletal:        General: No deformity. Normal range of motion.     Cervical back:  Neck supple.     Comments: No track marks  Skin:    General: Skin is warm and dry.     Capillary Refill: Capillary refill takes less than 2 seconds.     Comments: Significant acne and pustules on face and neck  Neurological:     General: No focal deficit present.     ED Results / Procedures / Treatments   Labs (all labs ordered are listed, but only abnormal results are displayed) Labs Reviewed - No data to display  EKG None  Radiology No results found.  Procedures Procedures  {Document cardiac monitor, telemetry assessment procedure when appropriate:1}  Medications Ordered in ED Medications - No data to display  ED Course/ Medical Decision Making/ A&P   {   Click here for ABCD2, HEART and other calculatorsREFRESH Note before signing :1}                          Medical Decision Making  This patient complains of ***; this involves an extensive number of treatment Options and is a complaint that carries with it a high risk of complications and morbidity. The differential includes ***  I ordered, reviewed and interpreted labs, which included *** I ordered medication *** and reviewed PMP when indicated. I ordered imaging studies which included *** and I independently    visualized and interpreted imaging which showed *** Additional history obtained from *** Previous records obtained and reviewed *** I consulted *** and discussed lab and imaging findings and discussed disposition.  Cardiac monitoring reviewed, *** Social determinants considered, *** Critical Interventions: ***  After the interventions stated above, I reevaluated the patient and found *** Admission and further testing considered, ***   {Document critical care time when appropriate:1} {Document review of labs and clinical decision tools ie heart score, Chads2Vasc2 etc:1}  {Document your independent review of radiology images, and any outside records:1} {Document your discussion with family members,  caretakers, and with consultants:1} {Document social determinants of health affecting pt's care:1} {Document your decision making why or why not admission, treatments were needed:1} Final Clinical Impression(s) / ED Diagnoses Final diagnoses:  None    Rx / DC Orders ED Discharge Orders     None

## 2022-07-24 NOTE — ED Notes (Signed)
Patient is sleeping, he will wake up for a few seconds, significant other at bedside, she is also drowsy with her eyes closed.  Will continue to monitor.

## 2022-07-24 NOTE — ED Triage Notes (Signed)
PT BIB EMS for an overdose, was picked up at [place], was given Narcan 2 mg by fire department, D10 25 mg for a CBG of 53. Rescue breaths for 10 minutes.   This morning he overdosed and EMS was sent out to the home, for him and his girlfriend, both refused to be transferred earlier today after being Narcanned.    HR 68 O2 99 RA Resp12 BP 145/104 CBG 241  18G Left AC

## 2022-07-25 ENCOUNTER — Emergency Department (HOSPITAL_COMMUNITY): Payer: Self-pay
